# Patient Record
Sex: Female | Born: 1950 | Race: White | Hispanic: No | Marital: Single | Smoking: Never smoker
Health system: Southern US, Community
[De-identification: ages and names within clinical notes are randomized; demographics above are authoritative.]

## PROBLEM LIST (undated history)

## (undated) DIAGNOSIS — I10 Essential (primary) hypertension: Secondary | ICD-10-CM

## (undated) DIAGNOSIS — D369 Benign neoplasm, unspecified site: Secondary | ICD-10-CM

## (undated) DIAGNOSIS — F5104 Psychophysiologic insomnia: Secondary | ICD-10-CM

## (undated) DIAGNOSIS — G459 Transient cerebral ischemic attack, unspecified: Secondary | ICD-10-CM

## (undated) DIAGNOSIS — I700 Atherosclerosis of aorta: Secondary | ICD-10-CM

## (undated) DIAGNOSIS — G629 Polyneuropathy, unspecified: Secondary | ICD-10-CM

## (undated) DIAGNOSIS — F419 Anxiety disorder, unspecified: Secondary | ICD-10-CM

## (undated) DIAGNOSIS — M8580 Other specified disorders of bone density and structure, unspecified site: Secondary | ICD-10-CM

## (undated) HISTORY — DX: Polyneuropathy, unspecified: G62.9

## (undated) HISTORY — DX: Benign neoplasm, unspecified site: D36.9

## (undated) HISTORY — PX: BLEPHAROPLASTY: SUR158

## (undated) HISTORY — DX: Anxiety disorder, unspecified: F41.9

## (undated) HISTORY — DX: Atherosclerosis of aorta: I70.0

## (undated) HISTORY — PX: BREAST SURGERY: SHX581

## (undated) HISTORY — PX: LEG SURGERY: SHX1003

## (undated) HISTORY — PX: OTHER SURGICAL HISTORY: SHX169

## (undated) HISTORY — PX: VASCULAR SURGERY: SHX849

## (undated) HISTORY — PX: ABDOMINAL HYSTERECTOMY: SHX81

## (undated) HISTORY — DX: Psychophysiologic insomnia: F51.04

## (undated) HISTORY — PX: CHOLECYSTECTOMY: SHX55

## (undated) HISTORY — DX: Other specified disorders of bone density and structure, unspecified site: M85.80

## (undated) HISTORY — DX: Essential (primary) hypertension: I10

---

## 2007-02-17 ENCOUNTER — Encounter: Payer: Self-pay | Admitting: Gastroenterology

## 2007-03-20 ENCOUNTER — Encounter: Payer: Self-pay | Admitting: Gastroenterology

## 2007-04-01 ENCOUNTER — Encounter: Payer: Self-pay | Admitting: Gastroenterology

## 2007-04-09 ENCOUNTER — Encounter: Payer: Self-pay | Admitting: Gastroenterology

## 2007-04-16 ENCOUNTER — Encounter: Payer: Self-pay | Admitting: Gastroenterology

## 2007-04-18 ENCOUNTER — Ambulatory Visit: Payer: Self-pay | Admitting: Internal Medicine

## 2007-05-14 ENCOUNTER — Encounter: Payer: Self-pay | Admitting: Gastroenterology

## 2007-05-19 ENCOUNTER — Ambulatory Visit: Payer: Self-pay | Admitting: Internal Medicine

## 2007-05-23 ENCOUNTER — Ambulatory Visit: Payer: Self-pay | Admitting: Cardiology

## 2007-05-23 ENCOUNTER — Inpatient Hospital Stay (HOSPITAL_BASED_OUTPATIENT_CLINIC_OR_DEPARTMENT_OTHER): Admission: RE | Admit: 2007-05-23 | Discharge: 2007-05-23 | Payer: Self-pay | Admitting: Cardiology

## 2007-07-04 ENCOUNTER — Encounter: Payer: Self-pay | Admitting: Gastroenterology

## 2007-07-25 ENCOUNTER — Encounter: Payer: Self-pay | Admitting: Gastroenterology

## 2007-09-01 DIAGNOSIS — I00 Rheumatic fever without heart involvement: Secondary | ICD-10-CM | POA: Insufficient documentation

## 2007-09-01 DIAGNOSIS — R110 Nausea: Secondary | ICD-10-CM | POA: Insufficient documentation

## 2007-09-02 ENCOUNTER — Ambulatory Visit: Payer: Self-pay | Admitting: Gastroenterology

## 2007-09-02 DIAGNOSIS — F411 Generalized anxiety disorder: Secondary | ICD-10-CM | POA: Insufficient documentation

## 2007-09-02 DIAGNOSIS — J189 Pneumonia, unspecified organism: Secondary | ICD-10-CM | POA: Insufficient documentation

## 2007-09-02 DIAGNOSIS — R51 Headache: Secondary | ICD-10-CM

## 2007-09-02 DIAGNOSIS — K589 Irritable bowel syndrome without diarrhea: Secondary | ICD-10-CM

## 2007-09-02 DIAGNOSIS — K8020 Calculus of gallbladder without cholecystitis without obstruction: Secondary | ICD-10-CM | POA: Insufficient documentation

## 2007-09-02 DIAGNOSIS — R519 Headache, unspecified: Secondary | ICD-10-CM | POA: Insufficient documentation

## 2007-09-02 DIAGNOSIS — F329 Major depressive disorder, single episode, unspecified: Secondary | ICD-10-CM | POA: Insufficient documentation

## 2007-09-02 DIAGNOSIS — E785 Hyperlipidemia, unspecified: Secondary | ICD-10-CM | POA: Insufficient documentation

## 2007-09-02 LAB — CONVERTED CEMR LAB
Ferritin: 52.6 ng/mL (ref 10.0–291.0)
Iron: 43 ug/dL (ref 42–145)
Tissue Transglutaminase Ab, IgA: 0.2 units (ref <7)

## 2007-09-10 ENCOUNTER — Encounter: Payer: Self-pay | Admitting: Gastroenterology

## 2007-10-14 ENCOUNTER — Ambulatory Visit: Payer: Self-pay | Admitting: Gastroenterology

## 2008-05-15 DIAGNOSIS — R002 Palpitations: Secondary | ICD-10-CM

## 2008-05-15 DIAGNOSIS — I4949 Other premature depolarization: Secondary | ICD-10-CM | POA: Insufficient documentation

## 2008-05-15 DIAGNOSIS — R079 Chest pain, unspecified: Secondary | ICD-10-CM

## 2008-05-24 ENCOUNTER — Encounter: Payer: Self-pay | Admitting: Internal Medicine

## 2008-05-24 ENCOUNTER — Ambulatory Visit: Payer: Self-pay | Admitting: Internal Medicine

## 2010-06-27 NOTE — Cardiovascular Report (Signed)
[name], Amanda Middleton NO.:  [id number]   MEDICAL RECORD NO.:  [id number]          PATIENT TYPE:  OIB   LOCATION:  1962                         FACILITY:  [facility]   PHYSICIAN:  Arturo Morton. Riley Kill, MD, FACCDATE OF BIRTH:  [birth year]   DATE OF PROCEDURE:  DATE OF DISCHARGE:  05/23/2007                            CARDIAC CATHETERIZATION   INDICATIONS:  Ms. Coppinger is a 60 year old woman who presents with  recurrent episodes of chest pain and frequent palpitations.  She has had  a thorough GI evaluation.  Current study was done to assess coronary  anatomy.  The patient does have rather marked hypercholesterolemia.   PROCEDURES:  1. Left heart catheterization.  2. Selective coronary arteriography.  3. Selective left ventriculography.   DESCRIPTION OF PROCEDURE:  The patient was brought to the cath lab,  prepped and draped in the usual fashion.  Through an anterior puncture,  the femoral artery was entered and a 4-French sheath was placed.  Subcutaneous Xylocaine was used for local anesthesia.  Using 4-French  catheters, views of the left and right coronaries were obtained in  multiple angiographic projections.  Central aortic and left ventricular  pressures were measured with a pigtail and ventriculography was then  performed in the RAO projection.  The procedure was tolerated without  difficulty.  Hemostasis was achieved by direct manual compression by the  cath lab staff.  The images were reviewed with the patient, and with her  permission, reviewed with the patient's brother and sister.  There were  no major complications.   HEMODYNAMIC DATA:  1. Central aortic pressure 127/76, mean 101.  2. Left ventricular pressure 133/19.  3. No gradient or pullback across the aortic valve.   ANGIOGRAPHIC DATA:  1. Ventriculography was done in the RAO projection.  Overall systolic      function was preserved.  No segmental abnormalities or contraction      were identified.  2. The ejection fraction appeared to be in excess of about 55%.  3. On plain fluoroscopy, there was evidence of calcification in the      proximal LAD.  However, there do not appear to be high-grade      obstruction at this location.  4. The left main was free of critical disease.  5. The LAD course to the apex.  There were multiple septal perforators      and a large major diagonal branch.  Other than the calcification      proximally, no significant obstruction was noted.  There was no      high-grade lesion noted at the site of calcification.  6. The circumflex provides several large marginal branches and 2      smaller marginal branches.  The circumflex and its branches      appeared to be free of significant disease.  7. The right coronary is large-caliber vessel, and smooth throughout      its appearance.  No major obstruction was noted.   CONCLUSIONS:  1. Well-preserved overall left ventricular function.  2. Calcification of the proximal left anterior descending artery.  3. Hypercholesterolemia.  4. No evidence of significant coronary obstruction.   PLAN:  1. The patient will follow up Dr. Lewayne Bunting in the office for      further evaluation.  2. Consideration of management of hypercholesterolemia will be      considered based upon accepted excepted criteria.      Arturo Morton. Riley Kill, MD, [facility]  Electronically Signed     TDS/MEDQ  D:  05/23/2007  T:  05/24/2007  Job:  [id number]   cc:   Yolanda Manges. Ladona Ridgel, MD

## 2010-06-27 NOTE — Assessment & Plan Note (Signed)
[facility]                         ELECTROPHYSIOLOGY OFFICE NOTE   Amanda Middleton, Amanda Middleton                        MRN:          [id number]  DATE:04/18/2007                            DOB:          03-19-1950    Amanda Middleton is referred today by Dr. Jeanmarie Plant in [facility] for  evaluation of irregular heartbeats.  The patient has multiple medical  problems, but her main complaint is that over the last several years she  has had severe nausea, headache and palpitations.  She has documented  PVCs on cardiac monitoring.  She has a history of persistent headache  and nausea which date back to November of 2007.  She has seen multiple  specialists without much improvement.  She has never had syncope but she  has severe chest pain, almost all the time.  It is not related to  exertion.  It is unexplained.   PAST MEDICAL HISTORY:  1. Cholecystectomy in January of 2008.  2. History of a breast cyst.  3. History of palpitations.  4. History of a hysterectomy in the past.   FAMILY HISTORY:  Notable for both parents being deceased; her mother of  breast cancer, her father of coronary disease in his 98s.   MEDICATIONS:  1. Xanax.  2. Aspirin.  3. Clomipramine.  4. Tramadol.   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   REVIEW OF SYSTEMS:  Otherwise negative, except for problems with anxiety  and depression, dysuria and gastroesophageal reflux disease.   PHYSICAL EXAMINATION:  GENERAL:  She is a pleasant middle-aged woman in  no acute distress.  VITAL SIGNS:  Blood pressure today was 142/100, the pulse was 100 and  regular, respirations were 18, weight was 181 pounds.  HEENT:  Normocephalic and atraumatic.  Pupils equal and round.  The  oropharynx was moist.  Sclerae were anicteric.  NECK:  No jugular distention.  There was no thyromegaly.  Trachea was  midline.  Carotids were 2+ and  symmetric.  LUNGS:  Clear bilaterally to auscultation.  No wheezes, rales or rhonchi  were present.  There was no increased work of breathing.  CARDIOVASCULAR:  Regular rate and rhythm with normal S1 and S2.  There  were no murmurs, rubs or gallops that I could appreciate.  The PMI was  not enlarged and laterally displaced.  ABDOMEN:  Soft, nontender.  There was no organomegaly.  EXTREMITIES:  No sinus, clubbing or edema.  Pulses were 2+ and  symmetric.  NEUROLOGIC:  Alert and oriented x3.  Cranial nerves were intact.  Strength was 5/5 and symmetric.   ELECTROCARDIOGRAM:  EKG demonstrates sinus rhythm.  There was normal  axis and intervals, though we only had faxed copies of her EKGs to look  at.   IMPRESSION:  1. Severe palpitations.  2. Chest pain of unclear etiology.  3. History of recurrent chronic nausea and headaches.   DISCUSSION:  The etiology of the patient's symptoms is unclear to me.  I  have recommended that we try some  beta blockers to see if it will help  with her palpitations.  I cannot document any episodes of SVT or VT on  the cardiac monitor.  I will plan to see the patient back in several  weeks.     Amanda Canning. Ladona Ridgel, MD  Electronically Signed    GWT/MedQ  DD: 04/18/2007  DT: 04/20/2007  Job #: [id number]   cc:   Jeanmarie Plant

## 2010-06-27 NOTE — Assessment & Plan Note (Signed)
[facility]                         ELECTROPHYSIOLOGY OFFICE NOTE   AYNSLEY, FLEET                        MRN:          [id number]  DATE:05/19/2007                            DOB:          18-Sep-1950     Ms. Turberville presents today in followup.  She is a pleasant, middle-age  woman with a history of palpitations and documented PVCs as well as  nausea whom I saw initially back in March.  At that time, we started her  on some beta blockers to help prevent her palpitations.  She stated she  did well for week or two but over the last week or two has had chest  pressure which has been waxing and waning, worse yesterday.  This  pressure was initially related to exertion.  However, she has had  recurrent episodes which are not related to exertion.  There is no  associated nausea or vomiting, although she does have chronic and  intermittent nausea and headaches in the past.  The position does not  worsen or improve the pain.  The pain is described as a dull pressure  sensation.  It does not radiate.  She returns today for additional  evaluation.  She has had no syncope.   PHYSICAL EXAMINATION:  GENERAL:  Demonstrates a pleasant and very well-  appearing but somewhat anxious-appearing woman in no distress.  VITAL SIGNS:  Blood pressure was 148/98. The pulse was 92 and regular.  Respirations were 18.  Weight was 184 pounds.  NECK:  Revealed no jugular distention.  There is no thyromegaly.  Trachea is midline.  Carotid 2+ and symmetric.  LUNGS:  Clear bilaterally to auscultation.  No wheezes, rales or rhonchi  are present.  No increased work of breathing.  CARDIOVASCULAR:  Exam revealed a regular rate and rhythm with normal S1-  S2.  There is no S4 gallop.  ABDOMEN:  Soft, nontender.  There is no organomegaly.  EXTREMITIES:  Demonstrated no edema.   EKG is normal.   IMPRESSION:  1. Palpitations.  2. Chest pain.  3. History of PVCs.   DISCUSSION:   The patient does have a family history of coronary disease,  and her father died actually while undergoing catheterization.  The  patient's symptoms have some typical as well as atypical features.  She  is quite anxious.  She very much would like to be absolutely certain to  know whether or not she has coronary disease.  To this end, I have  recommended proceeding with left heart catheterization to define her  coronary anatomy.  This will be scheduled in a week  or so as her schedule permits.  Obviously, if she has recurrent chest  pain, she should go to the emergency room.     Doylene Canning. Ladona Ridgel, MD  Electronically Signed    GWT/MedQ  DD: 05/19/2007  DT: 05/19/2007  Job #: [id number]   cc:   Jeanmarie Plant

## 2013-09-21 DIAGNOSIS — I10 Essential (primary) hypertension: Secondary | ICD-10-CM | POA: Insufficient documentation

## 2014-12-23 ENCOUNTER — Encounter (HOSPITAL_COMMUNITY): Payer: Self-pay | Admitting: *Deleted

## 2014-12-23 ENCOUNTER — Emergency Department (HOSPITAL_COMMUNITY)
Admission: EM | Admit: 2014-12-23 | Discharge: 2014-12-24 | Disposition: A | Discharge status: Home / Self Care | Payer: Medicare Other | Attending: Emergency Medicine | Admitting: Emergency Medicine

## 2014-12-23 DIAGNOSIS — R531 Weakness: Secondary | ICD-10-CM | POA: Diagnosis not present

## 2014-12-23 DIAGNOSIS — R51 Headache: Secondary | ICD-10-CM | POA: Diagnosis not present

## 2014-12-23 DIAGNOSIS — R519 Headache, unspecified: Secondary | ICD-10-CM

## 2014-12-23 DIAGNOSIS — R479 Unspecified speech disturbances: Secondary | ICD-10-CM | POA: Insufficient documentation

## 2014-12-23 DIAGNOSIS — R110 Nausea: Secondary | ICD-10-CM | POA: Diagnosis not present

## 2014-12-23 DIAGNOSIS — Z79899 Other long term (current) drug therapy: Secondary | ICD-10-CM | POA: Insufficient documentation

## 2014-12-23 DIAGNOSIS — Z7982 Long term (current) use of aspirin: Secondary | ICD-10-CM | POA: Insufficient documentation

## 2014-12-23 DIAGNOSIS — Z8673 Personal history of transient ischemic attack (TIA), and cerebral infarction without residual deficits: Secondary | ICD-10-CM | POA: Diagnosis not present

## 2014-12-23 DIAGNOSIS — R410 Disorientation, unspecified: Secondary | ICD-10-CM | POA: Insufficient documentation

## 2014-12-23 HISTORY — DX: Transient cerebral ischemic attack, unspecified: G45.9

## 2014-12-23 LAB — CBC
HCT: 39.7 % (ref 36.0–46.0)
HEMOGLOBIN: 13.3 g/dL (ref 12.0–15.0)
MCH: 29.8 pg (ref 26.0–34.0)
MCHC: 33.5 g/dL (ref 30.0–36.0)
MCV: 89 fL (ref 78.0–100.0)
PLATELETS: 259 10*3/uL (ref 150–400)
RBC: 4.46 MIL/uL (ref 3.87–5.11)
RDW: 13.4 % (ref 11.5–15.5)
WBC: 8.7 10*3/uL (ref 4.0–10.5)

## 2014-12-23 LAB — BASIC METABOLIC PANEL WITH GFR
Anion gap: 8 (ref 5–15)
BUN: 13 mg/dL (ref 6–20)
CO2: 30 mmol/L (ref 22–32)
Calcium: 9.3 mg/dL (ref 8.9–10.3)
Chloride: 101 mmol/L (ref 101–111)
Creatinine, Ser: 0.85 mg/dL (ref 0.44–1.00)
GFR calc Af Amer: >60 mL/min
GFR calc non Af Amer: >60 mL/min
Glucose, Bld: 119 mg/dL — ABNORMAL HIGH (ref 65–99)
Potassium: 3.1 mmol/L — ABNORMAL LOW (ref 3.5–5.1)
Sodium: 139 mmol/L (ref 135–145)

## 2014-12-23 LAB — CBG MONITORING, ED: GLUCOSE-CAPILLARY: 124 mg/dL — AB (ref 65–99)

## 2014-12-23 NOTE — ED Notes (Signed)
Pt CBG result was 124. Informed RN.

## 2014-12-23 NOTE — ED Notes (Signed)
Pt declines offer for zofran ODT at this time - states "that does not do anything for me." Pt also admits to taking a sleeping pill pta.

## 2014-12-23 NOTE — ED Provider Notes (Signed)
CSN: [id number]   Arrival date & time 12/23/14 2050  History  By signing my name below, I, Altamease Oiler, attest that this documentation has been prepared under the direction and in the presence of Jola Schmidt, MD. Electronically Signed: Altamease Oiler, ED Scribe. 12/24/2014. 2:09 AM.  Chief Complaint  Patient presents with  . Nausea  . Weakness    HPI The history is provided by the patient. No language interpreter was used.   Amanda Middleton is a 64 y.o. female with history of TIA and neuropathy who presents to the Emergency Department complaining of intermittent left sided headache with onset 2 weeks ago. She describes the pain as throbbing "like your heart"" and notes that it has become more constant in the last 2 days. 800 mg Ibuprofen provided transient relief in pain at home. Associated symptoms include contractions ("drawing up") in both hands (L>R) stating "I keep dropping things", 6 days of near daily nausea, generalized weakness that she associates with the nausea, and speech difficulty ("sometimes I can't think what to say"). She saw her PCP in [place] 6 days ago where she had no testing and was referred to a neurologist. She was also evaluated in the ED at [facility] 6 days ago after the onset of weakness where she saw a neurologist and had a negative MRI and CT head. Pt was also evaluated in the ED at [facility] 4 days ago where she was again evaluated for generalized weakness and had another negative head CT. The headache and nausea persisted and 2 days ago she was evaluated at another ED and treated for constipation. Over the course of her ED visits she determined that neither Zofran nor phenergan improve her nausea. She has no nausea at present. Pt denies vomiting.   Church members at the bedside endorse that the patient's speech has seemed different and her behavior has been abnormal over the last several days. They deny knowledge of any drug or alcohol use and endorse "20  years worth of deep mental health stuff".   Past Medical History  Diagnosis Date  . TIA (transient ischemic attack)     Past Surgical History  Procedure Laterality Date  . Abdominal hysterectomy    . Cholecystectomy    . Breast surgery      History reviewed. No pertinent family history.  Social History  Substance Use Topics  . Smoking status: Never Smoker   . Smokeless tobacco: None  . Alcohol Use: No     Review of Systems 10 Systems reviewed and all are negative for acute change except as noted in the HPI. Home Medications   Prior to Admission medications   Medication Sig Start Date End Date Taking? Authorizing Provider  amLODipine (NORVASC) 5 MG tablet Take 5 mg by mouth daily.   Yes Historical Provider, MD  aspirin EC 81 MG tablet Take 81 mg by mouth daily.   Yes Historical Provider, MD  benazepril-hydrochlorthiazide (LOTENSIN HCT) 20-25 MG tablet Take 1 tablet by mouth daily.   Yes Historical Provider, MD  cholecalciferol (VITAMIN D) 1000 UNITS tablet Take 1,000 Units by mouth daily.   Yes Historical Provider, MD  gabapentin (NEURONTIN) 100 MG capsule Take 100 mg by mouth 2 (two) times daily.   Yes Historical Provider, MD  metoprolol succinate (TOPROL-XL) 25 MG 24 hr tablet Take 25 mg by mouth daily.   Yes Historical Provider, MD  zolpidem (AMBIEN) 10 MG tablet Take 10 mg by mouth at bedtime.   Yes Historical  Provider, MD    Allergies  Review of patient's allergies indicates no known allergies.  Triage Vitals: BP 107/63 mmHg  Pulse 63  Temp(Src) 97.6 F (36.4 C) (Oral)  Resp 10  Wt 174 lb 4 oz (79.039 kg)  SpO2 98%  Physical Exam  Constitutional: She is oriented to person, place, and time. She appears well-developed and well-nourished.  HENT:  Head: Normocephalic and atraumatic.  Eyes: Pupils are equal, round, and reactive to light.  Cardiovascular: Regular rhythm.   Pulmonary/Chest: Effort normal.  Abdominal: Soft.  Musculoskeletal: Normal range of motion.   Neurological: She is alert and oriented to person, place, and time. She has normal reflexes. She displays normal reflexes. No cranial nerve deficit. She exhibits normal muscle tone. Coordination normal.  5/5 strength in major muscle groups of  bilateral upper and lower extremities. Speech normal. No facial asymetry.   Nursing note and vitals reviewed.   ED Course  .Lumbar Puncture Performed by: Jola Schmidt Authorized by: Jola Schmidt Consent: Verbal consent obtained. Risks and benefits: risks, benefits and alternatives were discussed Consent given by: patient Time out: Immediately prior to procedure a "time out" was called to verify the correct patient, procedure, equipment, support staff and site/side marked as required. Indications: evaluation for infection and evaluation for altered mental status Anesthesia: local infiltration Local anesthetic: lidocaine 1% without epinephrine Anesthetic total: 12 ml Patient sedated: no Preparation: Patient was prepped and draped in the usual sterile fashion. Lumbar space: L3-L4 interspace Patient's position: left lateral decubitus Needle gauge: 20 Number of attempts: 2 Opening pressure: 23 cm H2O Fluid appearance: clear Tubes of fluid: 4 Total volume: 4 ml Post-procedure: site cleaned, pressure dressing applied and adhesive bandage applied Patient tolerance: Patient tolerated the procedure well with no immediate complications    DIAGNOSTIC STUDIES: Oxygen Saturation is 98% on RA, normal by my interpretation.    COORDINATION OF CARE: 11:25 PM Discussed treatment plan which includes lab work, EKG, CT head, and LP with pt at bedside and pt agreed to plan.  2:08 AM I re-evaluated the patient and her headache has resolved.   Labs Reviewed  BASIC METABOLIC PANEL - Abnormal; Notable for the following:    Potassium 3.1 (*)    Glucose, Bld 119 (*)    All other components within normal limits  URINALYSIS, ROUTINE W REFLEX MICROSCOPIC (NOT  AT [facility]) - Abnormal; Notable for the following:    Leukocytes, UA SMALL (*)    All other components within normal limits  CSF CELL COUNT WITH DIFFERENTIAL - Abnormal; Notable for the following:    RBC Count, CSF 16 (*)    All other components within normal limits  CBG MONITORING, ED - Abnormal; Notable for the following:    Glucose-Capillary 124 (*)    All other components within normal limits  GRAM STAIN  CSF CULTURE  CBC  AMMONIA  ETHANOL  URINE RAPID DRUG SCREEN, HOSP PERFORMED  GLUCOSE, CSF  CSF CELL COUNT WITH DIFFERENTIAL  PROTEIN, CSF  URINE MICROSCOPIC-ADD ON  HEPATIC FUNCTION PANEL    Imaging Review Ct Head Wo Contrast  12/24/2014  CLINICAL DATA:  Altered mental status and headache EXAM: CT HEAD WITHOUT CONTRAST TECHNIQUE: Contiguous axial images were obtained from the base of the skull through the vertex without intravenous contrast. COMPARISON:  12/19/2014 FINDINGS: Skull and Sinuses:Negative for fracture or destructive process. The mastoids, middle ears, and imaged paranasal sinuses are clear. Partly mineralized subcutaneous nodule in the left parietal scalp, likely a dermal inclusion cyst Orbits:  No acute abnormality. Brain: No evidence of acute infarction, hemorrhage, hydrocephalus, or mass lesion/mass effect. IMPRESSION: No acute finding or explanation for headache. Electronically Signed   By: Monte Fantasia M.D.   On: 12/24/2014 00:13   I personally reviewed and evaluated these images and lab results as a part of my medical decision-making.   EKG Interpretation  Date/Time:  Thursday December 23 2014 20:59:57 EST Ventricular Rate:  81 PR Interval:  164 QRS Duration: 80 QT Interval:  366 QTC Calculation: 425 R Axis:   40 Text Interpretation:  Normal sinus rhythm Nonspecific ST abnormality Abnormal ECG No old tracing to compare Confirmed by Merlina Marchena  MD, Lennette Bihari ([id number]) on 12/24/2014 12:43:14 AM    MDM   Final diagnoses:  Headache, unspecified headache type   Nausea  Confusion    Patient has had extensive workup at outside hospital including neurology consultation, CT head and MRI.  Patient reports this persistent headache.  It seems to be an intermittent issue but not improving.  Today it's been worse.  Friends are also concerned that she's had slightly more confusion.  Considerations initially included encephalitis and thus lumbar puncture was performed.  LP is without significant abnormalities.  Patient was given treatment for her headache as a possible migraine with Reglan and pain medication.  She had complete resolution of her headache.  She had complete resolution of her nausea.  She's tolerating fluids.  She walked around the emergency department without difficulty.  She feels much better at this time.  This may be/migraine variant.  She will need outpatient neurology follow-up.   I personally performed the services described in this documentation, which was scribed in my presence. The recorded information has been reviewed and is accurate.       Jola Schmidt, MD 12/24/14 [phone]

## 2014-12-23 NOTE — ED Notes (Signed)
Pt reports multiple symptoms x2 weeks - pt admits to hx of dx of neuropathy and is attempting to be seen by neurology - pt states she experiences intermittent episodes of weakness and "hands drawing up" only when pt has acute onset of nausea which pt reports occurs every 2 days. Pt was seen at [place] [facility] ER on Sunday for weakness. When patient questioned about focal weakness to left arm and leg pt states "when I have this nausea I feel like I am going to snap, I have pain and such in my left leg but get weak all over when I'm nauseous, I have no idea when this all started." Pt w/o any gross neuro deficits - grip strengths equal to both sides and equal leg strength. No facial droop or slurred speech. Pt reports difficulty finding her words and understanding others intermittently x1 week. Pt A&Ox4, speaking complete sentences. No acute distress.

## 2014-12-23 NOTE — ED Notes (Signed)
Taken for CT scan at this time. 

## 2014-12-24 ENCOUNTER — Emergency Department (HOSPITAL_COMMUNITY): Payer: Medicare Other

## 2014-12-24 DIAGNOSIS — R110 Nausea: Secondary | ICD-10-CM | POA: Diagnosis not present

## 2014-12-24 LAB — GRAM STAIN

## 2014-12-24 LAB — URINALYSIS, ROUTINE W REFLEX MICROSCOPIC
Bilirubin Urine: NEGATIVE
GLUCOSE, UA: NEGATIVE mg/dL
Hgb urine dipstick: NEGATIVE
KETONES UR: NEGATIVE mg/dL
NITRITE: NEGATIVE
PROTEIN: NEGATIVE mg/dL
Specific Gravity, Urine: 1.009 (ref 1.005–1.030)
UROBILINOGEN UA: 1 mg/dL (ref 0.0–1.0)
pH: 6.5 (ref 5.0–8.0)

## 2014-12-24 LAB — CSF CELL COUNT WITH DIFFERENTIAL
RBC COUNT CSF: 0 /mm3
RBC COUNT CSF: 16 /mm3 — AB
TUBE #: 1
Tube #: 4
WBC CSF: 2 /mm3 (ref 0–5)
WBC, CSF: 2 /mm3 (ref 0–5)

## 2014-12-24 LAB — RAPID URINE DRUG SCREEN, HOSP PERFORMED
Amphetamines: NOT DETECTED
Barbiturates: NOT DETECTED
Benzodiazepines: NOT DETECTED
COCAINE: NOT DETECTED
OPIATES: NOT DETECTED
TETRAHYDROCANNABINOL: NOT DETECTED

## 2014-12-24 LAB — HEPATIC FUNCTION PANEL
ALBUMIN: 4 g/dL (ref 3.5–5.0)
ALK PHOS: 82 U/L (ref 38–126)
ALT: 17 U/L (ref 14–54)
AST: 20 U/L (ref 15–41)
BILIRUBIN TOTAL: 0.7 mg/dL (ref 0.3–1.2)
Bilirubin, Direct: 0.1 mg/dL (ref 0.1–0.5)
Indirect Bilirubin: 0.6 mg/dL (ref 0.3–0.9)
Total Protein: 7 g/dL (ref 6.5–8.1)

## 2014-12-24 LAB — GLUCOSE, CSF: GLUCOSE CSF: 57 mg/dL (ref 40–70)

## 2014-12-24 LAB — AMMONIA: AMMONIA: 19 umol/L (ref 9–35)

## 2014-12-24 LAB — PROTEIN, CSF: Total  Protein, CSF: 42 mg/dL (ref 15–45)

## 2014-12-24 LAB — URINE MICROSCOPIC-ADD ON

## 2014-12-24 LAB — ETHANOL

## 2014-12-24 MED ORDER — METOCLOPRAMIDE HCL 5 MG/ML IJ SOLN
10.0000 mg | Freq: Once | INTRAMUSCULAR | Status: AC
  Administered 2014-12-24: 10 mg via INTRAVENOUS
  Filled 2014-12-24: qty 2

## 2014-12-24 MED ORDER — LIDOCAINE HCL (PF) 1 % IJ SOLN
30.0000 mL | Freq: Once | INTRAMUSCULAR | Status: AC
  Administered 2014-12-24: 30 mL
  Filled 2014-12-24: qty 30

## 2014-12-24 MED ORDER — FENTANYL CITRATE (PF) 100 MCG/2ML IJ SOLN
50.0000 ug | Freq: Once | INTRAMUSCULAR | Status: AC
  Administered 2014-12-24: 50 ug via INTRAVENOUS
  Filled 2014-12-24: qty 2

## 2014-12-24 MED ORDER — LORAZEPAM 2 MG/ML IJ SOLN
1.0000 mg | Freq: Once | INTRAMUSCULAR | Status: AC
  Administered 2014-12-24: 1 mg via INTRAVENOUS
  Filled 2014-12-24: qty 1

## 2014-12-24 MED ORDER — METOCLOPRAMIDE HCL 10 MG PO TABS: 10.0000 mg | ORAL_TABLET | Freq: Four times a day (QID) | ORAL | Status: DC

## 2014-12-24 NOTE — ED Notes (Signed)
Back from CT

## 2014-12-27 LAB — CSF CULTURE: CULTURE: NO GROWTH

## 2014-12-27 LAB — CSF CULTURE W GRAM STAIN

## 2015-01-21 ENCOUNTER — Encounter: Payer: Self-pay | Admitting: Neurology

## 2015-01-21 ENCOUNTER — Other Ambulatory Visit (INDEPENDENT_AMBULATORY_CARE_PROVIDER_SITE_OTHER): Payer: Commercial Managed Care - HMO

## 2015-01-21 ENCOUNTER — Ambulatory Visit (INDEPENDENT_AMBULATORY_CARE_PROVIDER_SITE_OTHER): Payer: Medicare Other | Admitting: Neurology

## 2015-01-21 VITALS — BP 126/74 | HR 70 | Ht 67.0 in | Wt 179.0 lb

## 2015-01-21 DIAGNOSIS — R110 Nausea: Secondary | ICD-10-CM

## 2015-01-21 DIAGNOSIS — F411 Generalized anxiety disorder: Secondary | ICD-10-CM

## 2015-01-21 DIAGNOSIS — F05 Delirium due to known physiological condition: Secondary | ICD-10-CM

## 2015-01-21 DIAGNOSIS — G609 Hereditary and idiopathic neuropathy, unspecified: Secondary | ICD-10-CM

## 2015-01-21 LAB — SEDIMENTATION RATE: SED RATE: 31 mm/h — AB (ref 0–22)

## 2015-01-21 LAB — TSH: TSH: 1.14 u[IU]/mL (ref 0.35–4.50)

## 2015-01-21 LAB — VITAMIN B12: Vitamin B-12: 382 pg/mL (ref 211–911)

## 2015-01-21 NOTE — Patient Instructions (Signed)
I really think the symptoms you were experiencing were anxiety.  However, I want to check another test, namely an EEG which records your brain waves. To look into causes of neuropathy, we will pursue further testing:  Check labs, including ANA, Sed Rate, B12, TSH, SPEP/IFE  We will schedule you for a nerve study test to evaluate for polyneuropathy Further tests pending results.  Follow up after testing

## 2015-01-21 NOTE — Progress Notes (Signed)
NEUROLOGY CONSULTATION NOTE  Amanda Middleton MRN: [id number] DOB: 06/21/1950  Referring provider: ED referral Primary care provider: Grier Rocher  Reason for consult:  Headache  HISTORY OF PRESENT ILLNESS: Amanda Middleton is a 64 year old right-handed female with neuropathy, anxiety, depression and history of TIA who presents for episode of nausea and confusion, as well as neuropathy.  MRI reports from [facility] reviewed.  Imaging of brain CT from 12/24/14 was reviewed.  Labs reviewed.  A couple of months ago, she reports being under a great deal of stress, which were family-related, concerning the church, as well as the current politics of the election.  In late-October/early-November, she had a throbbing in the left side of there head, which lasted a couple of weeks.  After it resolved, she began having spells in which she developed acute onset of severe nausea without vomiting.  She had a sensation of diffuse weakness.  There was no associated headache or visual disturbance.  Afterwards, she would report language deficits, such as nonsensical speech or difficulty reading or writing.  She also would lose her train of thought when talking with friends and family.  Over a period of two weeks, she went to 4 different EDs for evaluation.  She went to the ED at [facility], where she was evaluated by neurology.  CT and MRI/MRA/MRV of the head were unremarkable.  She then was seen in the ED at [facility], where another head CT was performed and was negative.  Due to persistent nausea and headache, she then followed up at another ED, where she was treated for constipation.  [facility] after friends reported she was acting more confused.  LP was performed to evaluate for encephalitis, which was negative.  Her language disturbance persisted after discharge.  For the past month, she decided to stay at home and rest.  Since that time, she has had resolution of nausea  and speech disturbance.  She feels more rested and less stress.  She reports episodes of diffuse weakness in the past and was told she had TIAs.  She denies family or personal history of seizures or migraines.  For two years, she has had gradual worsening of neuropathy pain.  She describes it as burning in her feet, and later in her knees and hands.  There is no associated weakness or numbness.  She says her brother had debilitating neuropathy which started the same way.  He was disabled at age 15.  He was told it was due to longstanding farm work.  She takes gabapentin 600mg  twice daily, which helps.  PAST MEDICAL HISTORY: Past Medical History  Diagnosis Date  . TIA (transient ischemic attack)     PAST SURGICAL HISTORY: Past Surgical History  Procedure Laterality Date  . Abdominal hysterectomy    . Cholecystectomy    . Breast surgery      MEDICATIONS: Current Outpatient Prescriptions on File Prior to Visit  Medication Sig Dispense Refill  . amLODipine (NORVASC) 5 MG tablet Take 5 mg by mouth daily.    Marland Kitchen aspirin EC 81 MG tablet Take 81 mg by mouth daily.    . benazepril-hydrochlorthiazide (LOTENSIN HCT) 20-25 MG tablet Take 1 tablet by mouth daily.    . cholecalciferol (VITAMIN D) 1000 UNITS tablet Take 1,000 Units by mouth daily.    Marland Kitchen gabapentin (NEURONTIN) 100 MG capsule Take 100 mg by mouth 2 (two) times daily.    . metoCLOPramide (REGLAN) 10  MG tablet Take 1 tablet (10 mg total) by mouth every 6 (six) hours. 12 tablet 0  . metoprolol succinate (TOPROL-XL) 25 MG 24 hr tablet Take 25 mg by mouth daily.     No current facility-administered medications on file prior to visit.    ALLERGIES: No Known Allergies  FAMILY HISTORY: No family history on file.  SOCIAL HISTORY: Social History   Social History  . Marital Status: Single    Spouse Name: N/A  . Number of Children: N/A  . Years of Education: N/A   Occupational History  . Not on file.   Social History Main Topics    . Smoking status: Never Smoker   . Smokeless tobacco: Not on file  . Alcohol Use: No  . Drug Use: No  . Sexual Activity: Not on file   Other Topics Concern  . Not on file   Social History Narrative   4 year college    REVIEW OF SYSTEMS: Constitutional: No fevers, chills, or sweats, no generalized fatigue, change in appetite Eyes: No visual changes, double vision, eye pain Ear, nose and throat: No hearing loss, ear pain, nasal congestion, sore throat Cardiovascular: No chest pain, palpitations Respiratory:  No shortness of breath at rest or with exertion, wheezes GastrointestinaI: Nausea resolved.  Some constipation Genitourinary:  No dysuria, urinary retention or frequency Musculoskeletal:  No neck pain, back pain Integumentary: No rash, pruritus, skin lesions Neurological: as above Psychiatric: insomnia, anxiety Endocrine: No palpitations, fatigue, diaphoresis, mood swings, change in appetite, change in weight, increased thirst Hematologic/Lymphatic:  No anemia, purpura, petechiae. Allergic/Immunologic: no itchy/runny eyes, nasal congestion, recent allergic reactions, rashes  PHYSICAL EXAM: Filed Vitals:   01/21/15 0805  BP: 126/74  Pulse: 70   General: No acute distress.  Patient appears well-groomed.  Head:  Normocephalic/atraumatic Eyes:  fundi unremarkable, without vessel changes, exudates, hemorrhages or papilledema. Neck: supple, no paraspinal tenderness, full range of motion Back: No paraspinal tenderness Heart: regular rate and rhythm Lungs: Clear to auscultation bilaterally. Vascular: No carotid bruits. Neurological Exam: Mental status: alert and oriented to person, place, and time, recent and remote memory intact, fund of knowledge intact, attention and concentration intact, speech fluent and not dysarthric, language intact. Cranial nerves: CN I: not tested CN II: pupils equal, round and reactive to light, visual fields intact, fundi unremarkable, without  vessel changes, exudates, hemorrhages or papilledema. CN III, IV, VI:  full range of motion, no nystagmus, no ptosis CN V: facial sensation intact CN VII: upper and lower face symmetric CN VIII: hearing intact CN IX, X: gag intact, uvula midline CN XI: sternocleidomastoid and trapezius muscles intact CN XII: tongue midline Bulk & Tone: normal, no fasciculations. Motor:  5/5 throughout  Sensation:  Pinprick sensation intact.  Mildly reduced vibration sensation in toes. Deep Tendon Reflexes:  2+ throughout, toes downgoing. Finger to nose testing:  Without dysmetria.  Heel to shin:  Without dysmetria.  Gait:  Normal station and stride.  Able to turn but difficulty with tandem walk. Romberg negative.  IMPRESSION: 1.  Recurrent episodes of nausea and diffuse weakness with speech disturbance.  I think this is related to anxiety.  Migraine is always possible but she has no prior history of headaches and these spells clearly coincided with period of great personal stress and has resolved after period of rest.  MRIs ruled out strokes.  2.  Neuropathy.  She reports family history.  She doesn't have high-arched feet, which may be seen in some hereditary neuropathies such  as Charcot Marie Tooth.  Will require further workup  PLAN: 1.  To further investigate these spells, will get EEG to evaluate for abnormalities that may suggest increased risk for seizures 2.  To further investigate neuropathy, will check ANA, Sed Rate, B12, TSH, SPEP/IFE and will order NCV-EMG 3.  Further testing and follow up after these tests. 4.  I asked her to get disc with MRI of brain from [facility] for review.  Thank you for allowing me to take part in the care of this patient.  Metta Clines, DO  CC:  Grier Rocher

## 2015-01-21 NOTE — Progress Notes (Signed)
Chart forwarded.  

## 2015-01-24 LAB — ANA: Anti Nuclear Antibody(ANA): NEGATIVE

## 2015-01-25 LAB — SPEP & IFE WITH QIG
ALBUMIN ELP: 4 g/dL (ref 3.8–4.8)
ALPHA-1-GLOBULIN: 0.3 g/dL (ref 0.2–0.3)
ALPHA-2-GLOBULIN: 0.7 g/dL (ref 0.5–0.9)
BETA 2: 0.3 g/dL (ref 0.2–0.5)
BETA GLOBULIN: 0.4 g/dL (ref 0.4–0.6)
Gamma Globulin: 1 g/dL (ref 0.8–1.7)
IGA: 141 mg/dL (ref 69–380)
IGG (IMMUNOGLOBIN G), SERUM: 997 mg/dL (ref 690–1700)
IgM, Serum: 256 mg/dL (ref 52–322)
TOTAL PROTEIN, SERUM ELECTROPHOR: 6.8 g/dL (ref 6.1–8.1)

## 2015-01-26 ENCOUNTER — Telehealth: Payer: Self-pay

## 2015-01-26 DIAGNOSIS — G609 Hereditary and idiopathic neuropathy, unspecified: Secondary | ICD-10-CM

## 2015-01-26 NOTE — Telephone Encounter (Signed)
-----   Message from Pieter Partridge, DO sent at 01/26/2015  7:06 AM EST ----- Labs looking for causes of neuropathy are normal.  I would like her to get a nerve conduction study to evaluate for neuropathy.  I want to see her after the nerve conduction study and EEG.

## 2015-01-26 NOTE — Telephone Encounter (Signed)
Message relayed to patient. Verbalized understanding and denied questions.   

## 2015-01-26 NOTE — Progress Notes (Signed)
Opened in error

## 2015-02-03 ENCOUNTER — Other Ambulatory Visit: Payer: Commercial Managed Care - HMO

## 2015-03-08 ENCOUNTER — Ambulatory Visit (INDEPENDENT_AMBULATORY_CARE_PROVIDER_SITE_OTHER): Payer: PRIVATE HEALTH INSURANCE | Admitting: Neurology

## 2015-03-08 DIAGNOSIS — F05 Delirium due to known physiological condition: Secondary | ICD-10-CM

## 2015-03-08 DIAGNOSIS — R110 Nausea: Secondary | ICD-10-CM

## 2015-03-08 DIAGNOSIS — G609 Hereditary and idiopathic neuropathy, unspecified: Secondary | ICD-10-CM

## 2015-03-08 DIAGNOSIS — F411 Generalized anxiety disorder: Secondary | ICD-10-CM

## 2015-03-08 DIAGNOSIS — R202 Paresthesia of skin: Secondary | ICD-10-CM

## 2015-03-08 NOTE — Procedures (Signed)
[facility]  [place], [address]  [place], [place] [address] Tel: [phone] Fax:  [phone] Test Date:  03/08/2015  Patient: Amanda Middleton DOB: May 11, 1950 Physician: Narda Amber  Sex: Female Height: 5\' 7"  Ref Phys: Metta Clines, M.D.  ID#: [id number] Temp: 34.8C Technician: Jerilynn Mages. Dean   Patient Complaints: This is a 65 years old female referred for evaluation of numbness in hands and feet bilaterally.  She has family history of hereditary neuropathy.  NCV & EMG Findings: Extensive electrodiagnostic testing of the left upper and lower extremity shows: 1. All sensory responses including the left median, ulnar, sural, and superficial peroneal nerves are within normal limits. 2. All motor responses including the left median, ulnar, tibial and peroneal nerves are within normal limits. 3. There is no evidence of active or chronic motor axon loss changes affecting any of the tested muscles. Motor unit configuration and recruitment pattern is within normal limits.  Impression: This is a normal study. In particular, there is no evidence of a large fiber generalized sensorimotor polyneuropathy, diffuse myopathy, or cervical/lumbosacral radiculopathy affecting the left side.  A small fiber neuropathy cannot be excluded by this study.   _____________________________ Narda Amber, D.O.    Nerve Conduction Studies Anti Sensory Summary Table   Stim Site NR Peak (ms) Norm Peak (ms) P-T Amp (V) Norm P-T Amp  Left Median Anti Sensory (2nd Digit)  34.8C  Wrist    3.1 <3.8 28.5 >10  Left Sup Peroneal Anti Sensory (Ant Lat Mall)  34.8C  12 cm    4.1 <4.6 5.3 >3  Left Sural Anti Sensory (Lat Mall)  34.8C  Calf    3.3 <4.6 4.2 >3  Left Ulnar Anti Sensory (5th Digit)  34.8C  Wrist    3.0 <3.2 16.3 >5   Motor Summary Table   Stim Site NR Onset (ms) Norm Onset (ms) O-P Amp (mV) Norm O-P Amp Site1 Site2 Delta-0 (ms) Dist (cm) Vel (m/s) Norm Vel (m/s)  Left Median Motor (Abd  Poll Brev)  34.8C  Wrist    3.8 <4.0 6.8 >5 Elbow Wrist 4.0 22.0 55 >50  Elbow    7.8  6.6         Left Peroneal Motor (Ext Dig Brev)  34.8C  Ankle    3.2 <6.0 2.7 >2.5 B Fib Ankle 7.2 33.0 46 >40  B Fib    10.4  2.3  Poplt B Fib 2.0 10.0 50 >40  Poplt    12.4  2.3         Left Tibial Motor (Abd Hall Brev)  34.8C  Ankle    3.1 <6.0 8.6 >4 Knee Ankle 8.5 38.0 45 >40  Knee    11.6  5.6         Left Ulnar Motor (Abd Dig Minimi)  34.8C  Wrist    2.7 <3.1 8.9 >7 B Elbow Wrist 3.0 19.0 63 >50  B Elbow    5.7  8.9  A Elbow B Elbow 1.5 10.0 67 >50  A Elbow    7.2  8.8          EMG   Side Muscle Ins Act Fibs Psw Fasc Number Recrt Dur Dur. Amp Amp. Poly Poly. Comment  Left Flex Dig Long Nml Nml Nml Nml Nml Nml Nml Nml Nml Nml Nml Nml N/A  Left AntTibialis Nml Nml Nml Nml Nml Nml Nml Nml Nml Nml Nml Nml N/A  Left Gastroc Nml Nml Nml Nml Nml Nml Nml  Nml Nml Nml Nml Nml N/A  Left RectFemoris Nml Nml Nml Nml Nml Nml Nml Nml Nml Nml Nml Nml N/A  Left BicepsFemS Nml Nml Nml Nml Nml Nml Nml Nml Nml Nml Nml Nml N/A  Left GluteusMed Nml Nml Nml Nml Nml Nml Nml Nml Nml Nml Nml Nml N/A  Left 1stDorInt Nml Nml Nml Nml Nml Nml Nml Nml Nml Nml Nml Nml N/A  Left Ext Indicis Nml Nml Nml Nml Nml Nml Nml Nml Nml Nml Nml Nml N/A  Left PronatorTeres Nml Nml Nml Nml Nml Nml Nml Nml Nml Nml Nml Nml N/A  Left Biceps Nml Nml Nml Nml Nml Nml Nml Nml Nml Nml Nml Nml N/A  Left Triceps Nml Nml Nml Nml Nml Nml Nml Nml Nml Nml Nml Nml N/A  Left Deltoid Nml Nml Nml Nml Nml Nml Nml Nml Nml Nml Nml Nml N/A      Waveforms:

## 2015-03-09 ENCOUNTER — Telehealth: Payer: Self-pay

## 2015-03-09 NOTE — Procedures (Signed)
ELECTROENCEPHALOGRAM REPORT  Date of Study: 03/08/2015  Patient's Name: Amanda Middleton MRN: [id number] Date of Birth: 08-26-1950  Indication: spells consisting of headache, cognitive and speech changes  Medications: Gabapentin Amlodipine Lotensin Toprol  Technical Summary: This is a multichannel digital EEG recording, using the international 10-20 placement system with electrodes applied with paste and impedances below 5000 ohms.    Description: The EEG background is symmetric, with a well-developed posterior dominant rhythm of 9 Hz, which is reactive to eye opening and closing.  Diffuse beta activity is seen, with a bilateral frontal preponderance.  No focal or generalized abnormalities are seen.  No focal or generalized epileptiform discharges are seen.  Stage II sleep is not seen.  Hyperventilation and photic stimulation were performed, and produced no abnormalities.  ECG revealed normal cardiac rate and rhythm.  Impression: This is a normal routine EEG of the awake and drowsy states, with activating procedures.  A normal study does not rule out the possibility of a seizure disorder in this patient.  Leif Loflin R. Tomi Likens, DO

## 2015-03-09 NOTE — Telephone Encounter (Signed)
-----   Message from Pieter Partridge, DO sent at 03/09/2015 12:44 PM EST ----- eeg is normal

## 2015-03-09 NOTE — Telephone Encounter (Signed)
Voicemail box was full. Will attempt to reach again later.

## 2015-03-09 NOTE — Telephone Encounter (Signed)
Voicemail box was full. Will send letter.

## 2015-03-09 NOTE — Telephone Encounter (Signed)
-----   Message from Pieter Partridge, DO sent at 03/08/2015  4:15 PM EST ----- Nerve study shows no evidence of nerve damage.

## 2015-05-26 ENCOUNTER — Ambulatory Visit: Payer: Commercial Managed Care - HMO | Admitting: Neurology

## 2016-10-03 ENCOUNTER — Emergency Department (HOSPITAL_COMMUNITY): Payer: Medicare Other

## 2016-10-03 ENCOUNTER — Emergency Department (HOSPITAL_COMMUNITY)
Admission: EM | Admit: 2016-10-03 | Discharge: 2016-10-03 | Disposition: A | Discharge status: Home / Self Care | Payer: Medicare Other | Attending: Emergency Medicine | Admitting: Emergency Medicine

## 2016-10-03 ENCOUNTER — Encounter (HOSPITAL_COMMUNITY): Payer: Self-pay | Admitting: *Deleted

## 2016-10-03 DIAGNOSIS — Z8673 Personal history of transient ischemic attack (TIA), and cerebral infarction without residual deficits: Secondary | ICD-10-CM | POA: Diagnosis not present

## 2016-10-03 DIAGNOSIS — Z79899 Other long term (current) drug therapy: Secondary | ICD-10-CM | POA: Diagnosis not present

## 2016-10-03 DIAGNOSIS — R51 Headache: Secondary | ICD-10-CM

## 2016-10-03 DIAGNOSIS — R1013 Epigastric pain: Secondary | ICD-10-CM | POA: Diagnosis not present

## 2016-10-03 DIAGNOSIS — R519 Headache, unspecified: Secondary | ICD-10-CM

## 2016-10-03 DIAGNOSIS — Z7982 Long term (current) use of aspirin: Secondary | ICD-10-CM | POA: Diagnosis not present

## 2016-10-03 LAB — URINALYSIS, ROUTINE W REFLEX MICROSCOPIC
Bilirubin Urine: NEGATIVE
GLUCOSE, UA: NEGATIVE mg/dL
HGB URINE DIPSTICK: NEGATIVE
KETONES UR: NEGATIVE mg/dL
LEUKOCYTES UA: NEGATIVE
NITRITE: NEGATIVE
PH: 8 (ref 5.0–8.0)
PROTEIN: NEGATIVE mg/dL
SPECIFIC GRAVITY, URINE: 1.004 — AB (ref 1.005–1.030)

## 2016-10-03 LAB — CBC
HEMATOCRIT: 38.1 % (ref 36.0–46.0)
HEMOGLOBIN: 12.5 g/dL (ref 12.0–15.0)
MCH: 29.6 pg (ref 26.0–34.0)
MCHC: 32.8 g/dL (ref 30.0–36.0)
MCV: 90.3 fL (ref 78.0–100.0)
Platelets: 235 10*3/uL (ref 150–400)
RBC: 4.22 MIL/uL (ref 3.87–5.11)
RDW: 13.6 % (ref 11.5–15.5)
WBC: 7.8 10*3/uL (ref 4.0–10.5)

## 2016-10-03 LAB — COMPREHENSIVE METABOLIC PANEL
ALBUMIN: 3.9 g/dL (ref 3.5–5.0)
ALT: 13 U/L — ABNORMAL LOW (ref 14–54)
ANION GAP: 10 (ref 5–15)
AST: 21 U/L (ref 15–41)
Alkaline Phosphatase: 65 U/L (ref 38–126)
BILIRUBIN TOTAL: 0.3 mg/dL (ref 0.3–1.2)
BUN: 8 mg/dL (ref 6–20)
CO2: 30 mmol/L (ref 22–32)
Calcium: 9.3 mg/dL (ref 8.9–10.3)
Chloride: 100 mmol/L — ABNORMAL LOW (ref 101–111)
Creatinine, Ser: 0.85 mg/dL (ref 0.44–1.00)
GFR calc Af Amer: >60 mL/min (ref >60)
Glucose, Bld: 126 mg/dL — ABNORMAL HIGH (ref 65–99)
POTASSIUM: 3.5 mmol/L (ref 3.5–5.1)
Sodium: 140 mmol/L (ref 135–145)
TOTAL PROTEIN: 6.7 g/dL (ref 6.5–8.1)

## 2016-10-03 LAB — LIPASE, BLOOD: LIPASE: 29 U/L (ref 11–51)

## 2016-10-03 MED ORDER — SODIUM CHLORIDE 0.9 % IV BOLUS (SEPSIS)
1000.0000 mL | Freq: Once | INTRAVENOUS | Status: AC
  Administered 2016-10-03: 1000 mL via INTRAVENOUS

## 2016-10-03 MED ORDER — IOPAMIDOL (ISOVUE-300) INJECTION 61%
INTRAVENOUS | Status: AC
  Administered 2016-10-03: 100 mL
  Filled 2016-10-03: qty 100

## 2016-10-03 NOTE — ED Triage Notes (Signed)
Pt comes with list of complaints.  Most notably headaches, nausea and abdominal discomfort.  She was seen at another emergency dept and they suggested endoscopy.

## 2016-10-03 NOTE — ED Provider Notes (Signed)
[facility] DEPT Provider Note   CSN: [id number] Arrival date & time: 10/03/16  1200     History   Chief Complaint Chief Complaint  Patient presents with  . Headache  . Abdominal Pain    HPI Amanda Middleton is a 66 y.o. female.  Patient is a 66 year old female with past medical history of anxiety, depression, irritable bowel. She presents today for evaluation of abdominal pain and headache. She reports her head "begins to pounds" when she eats. She also reports that everything she eats "turns into gas". She has belching and flatus excessively with anything she eats. This is been ongoing for the past week. She was seen at the [facility] ER for palpitations several days ago, however no cause was found. She was told that she may need an endoscopy and presents here hoping to have this done. She also complains of multiple other complaints including dizziness, weakness, and fatigue.   The history is provided by the patient.    Past Medical History:  Diagnosis Date  . TIA (transient ischemic attack)     Patient Active Problem List   Diagnosis Date Noted  . PREMATURE VENTRICULAR CONTRACTIONS 05/15/2008  . PALPITATIONS 05/15/2008  . CHEST PAIN 05/15/2008  . HYPERLIPIDEMIA 09/02/2007  . Anxiety state 09/02/2007  . DEPRESSION 09/02/2007  . PNEUMONIA 09/02/2007  . IRRITABLE BOWEL SYNDROME 09/02/2007  . CHOLELITHIASIS 09/02/2007  . HEADACHE, CHRONIC 09/02/2007  . RHEUMATIC FEVER 09/01/2007  . NAUSEA 09/01/2007    Past Surgical History:  Procedure Laterality Date  . ABDOMINAL HYSTERECTOMY    . BREAST SURGERY    . CHOLECYSTECTOMY    . VASCULAR SURGERY     L carotid    OB History    No data available       Home Medications    Prior to Admission medications   Medication Sig Start Date End Date Taking? Authorizing Provider  amLODipine (NORVASC) 5 MG tablet Take 5 mg by mouth daily.    [provider]  aspirin EC 81 MG tablet Take 81 mg by mouth daily.     [provider]  benazepril-hydrochlorthiazide (LOTENSIN HCT) 20-25 MG tablet Take 1 tablet by mouth daily.    [provider]  cholecalciferol (VITAMIN D) 1000 UNITS tablet Take 1,000 Units by mouth daily.    [provider]  clonazePAM (KLONOPIN) 0.5 MG tablet Take 0.5 mg by mouth. 12/21/14   [provider]  gabapentin (NEURONTIN) 100 MG capsule Take 100 mg by mouth 2 (two) times daily.    [provider]  metoCLOPramide (REGLAN) 10 MG tablet Take 1 tablet (10 mg total) by mouth every 6 (six) hours. 12/24/14   Jola Schmidt, MD  metoprolol succinate (TOPROL-XL) 25 MG 24 hr tablet Take 25 mg by mouth daily.    [provider]  ondansetron (ZOFRAN-ODT) 4 MG disintegrating tablet  12/05/14   [provider]    Family History No family history on file.  Social History Social History  Substance Use Topics  . Smoking status: Never Smoker  . Smokeless tobacco: Never Used  . Alcohol use No     Allergies   Patient has no known allergies.   Review of Systems Review of Systems  All other systems reviewed and are negative.    Physical Exam Updated Vital Signs BP 131/88 (BP Location: Left Arm)   Pulse 75   Temp 99.5 F (37.5 C) (Oral)   Resp 17   Ht 5\' 7"  (1.702 m)  Wt 70 kg (154 lb 4 oz)   SpO2 95%   BMI 24.16 kg/m   Physical Exam  Constitutional: She is oriented to person, place, and time. She appears well-developed and well-nourished. No distress.  HENT:  Head: Normocephalic and atraumatic.  Mouth/Throat: Oropharynx is clear and moist.  Eyes: Pupils are equal, round, and reactive to light. EOM are normal.  Neck: Normal range of motion. Neck supple.  Cardiovascular: Normal rate and regular rhythm.  Exam reveals no gallop and no friction rub.   No murmur heard. Pulmonary/Chest: Effort normal and breath sounds normal. No respiratory distress. She has no wheezes.  Abdominal: Soft. Bowel sounds are normal. She  exhibits no distension. There is no tenderness.  Musculoskeletal: Normal range of motion.  Neurological: She is alert and oriented to person, place, and time. No cranial nerve deficit. She exhibits normal muscle tone. Coordination normal.  Skin: Skin is warm and dry. She is not diaphoretic.  Nursing note and vitals reviewed.    ED Treatments / Results  Labs (all labs ordered are listed, but only abnormal results are displayed) Labs Reviewed  COMPREHENSIVE METABOLIC PANEL - Abnormal; Notable for the following:       Result Value   Chloride 100 (*)    Glucose, Bld 126 (*)    ALT 13 (*)    All other components within normal limits  URINALYSIS, ROUTINE W REFLEX MICROSCOPIC - Abnormal; Notable for the following:    Color, Urine COLORLESS (*)    Specific Gravity, Urine 1.004 (*)    All other components within normal limits  LIPASE, BLOOD  CBC    EKG  EKG Interpretation None       Radiology No results found.  Procedures Procedures (including critical care time)  Medications Ordered in ED Medications  sodium chloride 0.9 % bolus 1,000 mL (not administered)     Initial Impression / Assessment and Plan / ED Course  I have reviewed the triage vital signs and the nursing notes.  Pertinent labs & imaging results that were available during my care of the patient were reviewed by me and considered in my medical decision making (see chart for details).  Patient presents with multiple complaints including headache, abdominal pain, distention, and increased belching and flatus. Her physical examination is essentially unremarkable. Her workup reveals no laboratory abnormalities and imaging studies are negative for acute process.  The patient has had a colonoscopy in June, however his never had an endoscopy. She was advised by her primary doctor to come here for these arrangements can be made. She will be discharged with instructions to follow-up with GI. She is to return as needed in  the meantime if symptoms worsen or change.  Final Clinical Impressions(s) / ED Diagnoses   Final diagnoses:  None    New Prescriptions New Prescriptions   No medications on file     Veryl Speak, MD 10/03/16 [phone]

## 2016-10-03 NOTE — ED Notes (Signed)
Patient transported to CT 

## 2016-10-03 NOTE — Discharge Instructions (Signed)
Follow-up with gastroenterology. The contact information for [facility] has been provided in this discharge summary for you to call and make these arrangements.

## 2016-10-22 ENCOUNTER — Telehealth: Payer: Self-pay | Admitting: Neurology

## 2016-10-22 NOTE — Telephone Encounter (Signed)
Rec'd from [facility] forwarded 1 page to Metta Clines R DO

## 2017-01-29 ENCOUNTER — Ambulatory Visit (INDEPENDENT_AMBULATORY_CARE_PROVIDER_SITE_OTHER): Payer: Medicare Other | Admitting: Neurology

## 2017-01-29 ENCOUNTER — Encounter: Payer: Self-pay | Admitting: Neurology

## 2017-01-29 VITALS — Ht 67.0 in | Wt 168.8 lb

## 2017-01-29 DIAGNOSIS — G3184 Mild cognitive impairment, so stated: Secondary | ICD-10-CM | POA: Diagnosis not present

## 2017-01-29 DIAGNOSIS — R1310 Dysphagia, unspecified: Secondary | ICD-10-CM | POA: Insufficient documentation

## 2017-01-29 NOTE — Progress Notes (Signed)
PATIENT: Amanda Middleton DOB: Sep 01, 1950  Chief Complaint  Patient presents with  . Dysphagia    Says she had previously had difficulty with swallowing and nausea but these symptoms have resolved.  Says she is concerned about a benign fatty tumor on the left side of her neck.  Marland Kitchen PCP    Rupert Stacks, MD     HISTORICAL  Amanda Middleton is a 66 year old female, seen in refer by her primary care doctor Rupert Stacks for evaluation of dysphagia, initial evaluation was on January 29, 2017.  I reviewed and summarized the referring note, she has past medical history of hypertension, anxiety, she is a poor historian,  Per patient, he had multiple left parotid gland surgery in the past, most recent one was 2011, she always felt mild swallowing at the left neck, left chin area, in April 2018, she developed mild swallowing difficulty, noticed swollen mass in her left neck, eventually had a CT scan of the neck in September 2018, which showed fat-containing mass in the left neck, lateral to the spine, she was later evaluated by ENT, needle aspiration result on November 23, 2016, predominantly adipocytes, blood, no atypia  or malignancy identified  Her difficulty swallowing has much improved since November 2018, she denies double vision, no limb muscle weakness,  She reported episode of nausea, which has improved as well, CT head without contrast in August 2018, minimum supratentorium small vessel disease.  Laboratory evaluations in October 2018, vitamin B12 499, negative RPR, CBC showed hemoglobin of 12.9, CMP showed creatinine of 0.83,  She also complains of mild memory loss, CT head without contrast in August 2018, mild supratentorium small vessel disease, no acute abnormality.  REVIEW OF SYSTEMS: Full 14 system review of systems performed and notable only for palpitation, incontinence, memory loss, insomnia  ALLERGIES: No Known Allergies  HOME MEDICATIONS: Current Outpatient Medications    Medication Sig Dispense Refill  . amLODipine (NORVASC) 10 MG tablet Take 10 mg by mouth daily.    . ARIPiprazole (ABILIFY) 10 MG tablet Take 10 mg by mouth daily.    Marland Kitchen aspirin EC 81 MG tablet Take 81 mg by mouth daily.    . cholecalciferol (VITAMIN D) 1000 UNITS tablet Take 1,000 Units by mouth daily.    Marland Kitchen gabapentin (NEURONTIN) 300 MG capsule Take 300 mg by mouth 4 (four) times daily.    . hydrochlorothiazide (HYDRODIURIL) 25 MG tablet Take 25 mg by mouth daily.    . propranolol ER (INDERAL LA) 60 MG 24 hr capsule Take 60 mg by mouth daily.    Marland Kitchen zolpidem (AMBIEN) 5 MG tablet Take 5 mg by mouth at bedtime.     No current facility-administered medications for this visit.     PAST MEDICAL HISTORY: Past Medical History:  Diagnosis Date  . Anxiety   . Aortic atherosclerosis ([place])   . Benign tumor    left breast, left side of neck  . Chronic insomnia   . Hypertension   . Osteopenia   . Peripheral neuropathy   . TIA (transient ischemic attack)     PAST SURGICAL HISTORY: Past Surgical History:  Procedure Laterality Date  . ABDOMINAL HYSTERECTOMY    . BREAST SURGERY    . CHOLECYSTECTOMY    . LEG SURGERY    . parotid gland    . VASCULAR SURGERY     L carotid    FAMILY HISTORY: Family History  Problem Relation Age of Onset  . Breast cancer Mother   .  Heart disease Father     SOCIAL HISTORY:  Social History   Socioeconomic History  . Marital status: Single    Spouse name: Not on file  . Number of children: 0  . Years of education: college  . Highest education level: Bachelor's degree (e.g., BA, AB, BS)  Social Needs  . Financial resource strain: Not on file  . Food insecurity - worry: Not on file  . Food insecurity - inability: Not on file  . Transportation needs - medical: Not on file  . Transportation needs - non-medical: Not on file  Occupational History  . Occupation: Retired  Tobacco Use  . Smoking status: Never Smoker  . Smokeless tobacco: Never Used   Substance and Sexual Activity  . Alcohol use: No  . Drug use: No  . Sexual activity: Not on file  Other Topics Concern  . Not on file  Social History Narrative   Lives at home alone.   Right-handed.   1 cup caffeine on some days.     PHYSICAL EXAM   Vitals:   01/29/17 1152  Weight: 168 lb 12 oz (76.5 kg)  Height: 5\' 7"  (1.702 m)    Not recorded      Body mass index is 26.43 kg/m.  PHYSICAL EXAMNIATION:  Gen: NAD, conversant, well nourised, obese, well groomed                     Cardiovascular: Regular rate rhythm, no peripheral edema, warm, nontender. Eyes: Conjunctivae clear without exudates or hemorrhage Neck: Supple, no carotid bruits. Pulmonary: Clear to auscultation bilaterally   NEUROLOGICAL EXAM:  MENTAL STATUS: Speech:    Speech is normal; fluent and spontaneous with normal comprehension.  Cognition:     Orientation to time, place and person     Normal recent and remote memory     Normal Attention span and concentration     Normal Language, naming, repeating,spontaneous speech     Fund of knowledge   CRANIAL NERVES: CN II: Visual fields are full to confrontation. Fundoscopic exam is normal with sharp discs and no vascular changes. Pupils are round equal and briskly reactive to light. CN III, IV, VI: extraocular movement are normal. No ptosis. CN V: Facial sensation is intact to pinprick in all 3 divisions bilaterally. Corneal responses are intact.  CN VII: Face is symmetric with normal eye closure and smile. CN VIII: Hearing is normal to rubbing fingers CN IX, X: Palate elevates symmetrically. Phonation is normal. CN XI: Head turning and shoulder shrug are intact CN XII: Tongue is midline with normal movements and no atrophy.  MOTOR: There is no pronator drift of out-stretched arms. Muscle bulk and tone are normal. Muscle strength is normal.  REFLEXES: Reflexes are 2+ and symmetric at the biceps, triceps, knees, and ankles. Plantar responses are  flexor.  SENSORY: Intact to light touch, pinprick, positional sensation and vibratory sensation are intact in fingers and toes.  COORDINATION: Rapid alternating movements and fine finger movements are intact. There is no dysmetria on finger-to-nose and heel-knee-shin.    GAIT/STANCE: Cautious, mildly unsteady   DIAGNOSTIC DATA (LABS, IMAGING, TESTING) - I reviewed patient records, labs, notes, testing and imaging myself where available.   ASSESSMENT AND PLAN  Amanda Middleton is a 66 y.o. female    Reported dysphagia episodes  Need to rule out neuromuscular junctional disorder  Proceed with acetylcholine receptor antibody  Overall her symptoms has improved,     Marcial Pacas, M.D. Ph.D.  [facility] [address], [place], [place] [address] Ph: [phone] Fax: [phone]  CC: Rupert Stacks, MD

## 2017-02-07 ENCOUNTER — Telehealth: Payer: Self-pay | Admitting: *Deleted

## 2017-02-07 LAB — CK: CK TOTAL: 59 U/L (ref 24–173)

## 2017-02-07 LAB — ACETYLCHOLINE RECEPTOR AB, ALL: Acetylchol Block Ab: 16 % (ref 0–25)

## 2017-02-07 LAB — VITAMIN B12: Vitamin B-12: 477 pg/mL (ref 232–1245)

## 2017-02-07 LAB — TSH: TSH: 1.03 u[IU]/mL (ref 0.450–4.500)

## 2017-02-07 NOTE — Telephone Encounter (Signed)
Spoke to patient she is aware of results

## 2017-02-07 NOTE — Telephone Encounter (Signed)
-----   Message from Larey Seat, MD sent at 02/07/2017  2:01 PM EST ----- Negative for  acetycholinergic AB , normal B 12 and TSH

## 2017-02-19 ENCOUNTER — Other Ambulatory Visit (HOSPITAL_COMMUNITY): Payer: Self-pay | Admitting: Family Medicine

## 2017-02-19 DIAGNOSIS — R221 Localized swelling, mass and lump, neck: Secondary | ICD-10-CM

## 2017-03-04 ENCOUNTER — Ambulatory Visit (HOSPITAL_COMMUNITY): Payer: Medicare Other

## 2017-03-12 ENCOUNTER — Ambulatory Visit (HOSPITAL_COMMUNITY): Payer: Medicare Other

## 2018-11-04 ENCOUNTER — Other Ambulatory Visit: Payer: Self-pay | Admitting: Physician Assistant

## 2018-11-04 DIAGNOSIS — N6320 Unspecified lump in the left breast, unspecified quadrant: Secondary | ICD-10-CM

## 2018-11-06 ENCOUNTER — Other Ambulatory Visit: Payer: Self-pay | Admitting: Physician Assistant

## 2018-11-06 DIAGNOSIS — N6320 Unspecified lump in the left breast, unspecified quadrant: Secondary | ICD-10-CM

## 2018-11-28 ENCOUNTER — Ambulatory Visit
Admission: RE | Admit: 2018-11-28 | Discharge: 2018-11-28 | Disposition: A | Discharge status: Home / Self Care | Payer: Medicare Other | Source: Ambulatory Visit | Attending: Physician Assistant | Admitting: Physician Assistant

## 2018-11-28 DIAGNOSIS — N6320 Unspecified lump in the left breast, unspecified quadrant: Secondary | ICD-10-CM

## 2018-11-28 DIAGNOSIS — N6002 Solitary cyst of left breast: Secondary | ICD-10-CM | POA: Diagnosis not present

## 2019-12-28 ENCOUNTER — Ambulatory Visit (INDEPENDENT_AMBULATORY_CARE_PROVIDER_SITE_OTHER): Payer: Medicare Other | Admitting: Internal Medicine

## 2019-12-28 VITALS — BP 147/75 | HR 81 | Temp 98.6°F | Resp 14 | Ht 67.0 in | Wt 174.0 lb

## 2019-12-28 DIAGNOSIS — I10 Essential (primary) hypertension: Secondary | ICD-10-CM | POA: Diagnosis not present

## 2019-12-28 DIAGNOSIS — Z9989 Dependence on other enabling machines and devices: Secondary | ICD-10-CM | POA: Diagnosis not present

## 2019-12-28 DIAGNOSIS — G4733 Obstructive sleep apnea (adult) (pediatric): Secondary | ICD-10-CM

## 2019-12-28 NOTE — Patient Instructions (Signed)

## 2019-12-28 NOTE — Progress Notes (Signed)
[facility] [place], [place] [address]  Pulmonary Sleep Medicine   Office Visit Note  Patient Name: Amanda Middleton DOB: [birth year] MRN [id number]    Chief Complaint: Obstructive Sleep Apnea visit  Brief History:  Amanda Middleton is seen today for follow up The patient has a 3 year history of sleep apnea. Patient is using PAP nightly.  The patient feels more rested after sleeping with PAP.  She has nights when she doesn't sleep well especially when she has not been very active.The patient reports benefitin from PAP use. Reported sleepiness is  Slightly worse and the Epworth Sleepiness Score is 9 out of 24. The patient does not take naps but is nodding off more.. The patient complains of the following: dryness  The compliance download shows excellentcompliance with an average use time of 7:18 hours. The AHI is 3.6  The patient does not complain of limb movements disrupting sleep.  ROS  General: (-) fever, (-) chills, (-) night sweat Nose and Sinuses: (-) nasal stuffiness or itchiness, (-) postnasal drip, (-) nosebleeds, (-) sinus trouble. Mouth and Throat: (-) sore throat, (-) hoarseness. Neck: (-) swollen glands, (-) enlarged thyroid, (-) neck pain. Respiratory: - cough, - shortness of breath, - wheezing. Neurologic: - numbness, - tingling. Psychiatric: - anxiety, - depression   Current Medication: Outpatient Encounter Medications as of 12/28/2019  Medication Sig  . alendronate (FOSAMAX) 70 MG tablet Take 70 mg by mouth once a week.  Marland Kitchen amLODipine (NORVASC) 10 MG tablet Take 10 mg by mouth daily.  Marland Kitchen amLODipine (NORVASC) 5 MG tablet Take 5 mg by mouth daily.  Marland Kitchen aspirin EC 81 MG tablet Take 81 mg by mouth daily.  . cholecalciferol (VITAMIN D) 1000 UNITS tablet Take 1,000 Units by mouth daily.  Marland Kitchen gabapentin (NEURONTIN) 300 MG capsule Take 300 mg by mouth 4 (four) times daily.  . hydrochlorothiazide (HYDRODIURIL) 25 MG tablet Take 25 mg by mouth daily.  . propranolol  ER (INDERAL LA) 60 MG 24 hr capsule Take 60 mg by mouth daily.  . [DISCONTINUED] ARIPiprazole (ABILIFY) 10 MG tablet Take 10 mg by mouth daily.  . [DISCONTINUED] zolpidem (AMBIEN) 5 MG tablet Take 5 mg by mouth at bedtime.   No facility-administered encounter medications on file as of 12/28/2019.    Surgical History: Past Surgical History:  Procedure Laterality Date  . ABDOMINAL HYSTERECTOMY    . BREAST SURGERY    . CHOLECYSTECTOMY    . LEG SURGERY    . parotid gland    . VASCULAR SURGERY     L carotid    Medical History: Past Medical History:  Diagnosis Date  . Anxiety   . Aortic atherosclerosis ([place])   . Benign tumor    left breast, left side of neck  . Chronic insomnia   . Hypertension   . Osteopenia   . Peripheral neuropathy   . TIA (transient ischemic attack)     Family History: Non contributory to the present illness  Social History: Social History   Socioeconomic History  . Marital status: Single    Spouse name: Not on file  . Number of children: 0  . Years of education: college  . Highest education level: Bachelor's degree (e.g., BA, AB, BS)  Occupational History  . Occupation: Retired  Tobacco Use  . Smoking status: Never Smoker  . Smokeless tobacco: Never Used  Vaping Use  . Vaping Use: Never used  Substance and Sexual Activity  . Alcohol use: No  .  Drug use: No  . Sexual activity: Not on file  Other Topics Concern  . Not on file  Social History Narrative   Lives at home alone.   Right-handed.   1 cup caffeine on some days.   Social Determinants of Health   Financial Resource Strain:   . Difficulty of Paying Living Expenses: Not on file  Food Insecurity:   . Worried About Charity fundraiser in the Last Year: Not on file  . Ran Out of Food in the Last Year: Not on file  Transportation Needs:   . Lack of Transportation (Medical): Not on file  . Lack of Transportation (Non-Medical): Not on file  Physical Activity:   . Days of Exercise  per Week: Not on file  . Minutes of Exercise per Session: Not on file  Stress:   . Feeling of Stress : Not on file  Social Connections:   . Frequency of Communication with Friends and Family: Not on file  . Frequency of Social Gatherings with Friends and Family: Not on file  . Attends Religious Services: Not on file  . Active Member of Clubs or Organizations: Not on file  . Attends Archivist Meetings: Not on file  . Marital Status: Not on file  Intimate Partner Violence:   . Fear of Current or Ex-Partner: Not on file  . Emotionally Abused: Not on file  . Physically Abused: Not on file  . Sexually Abused: Not on file    Vital Signs: Blood pressure (!) 147/75, pulse 81, temperature 98.6 F (37 C), temperature source Temporal, resp. rate 14, height 5\' 7"  (1.702 m), weight 174 lb (78.9 kg), SpO2 98 %.  Examination: General Appearance: The patient is well-developed, well-nourished, and in no distress. Neck Circumference: 37 Skin: Gross inspection of skin unremarkable. Head: normocephalic, no gross deformities. Eyes: no gross deformities noted. ENT: ears appear grossly normal Neurologic: Alert and oriented. No involuntary movements.    EPWORTH SLEEPINESS SCALE:  Scale:  (0)= no chance of dozing; (1)= slight chance of dozing; (2)= moderate chance of dozing; (3)= high chance of dozing  Chance  Situtation    Sitting and reading: 2    Watching TV: 0    Sitting Inactive in public: 0    As a passenger in car: 3      Lying down to rest: 2    Sitting and talking: 0    Sitting quielty after lunch: 2    In a car, stopped in traffic: 0   TOTAL SCORE:   9 out of 24    SLEEP STUDIES:  1. PSG on 07/29/17 -AHI 21.4,  Lowest saturation 72%   CPAP COMPLIANCE DATA:  Date Range: 12/23/18 - 12/22/19   Average Daily Use: 7:18 hours  Median Use: 7:22  Compliance for > 4 Hours: 98%  AHI: 3.6 respiratory events per hour  Days Used: 365/365  Mask Leak: 29.9  lpm  95th Percentile Pressure: 3.6         LABS: No results found for this or any previous visit (from the past 2160 hour(s)).  Radiology: US BREAST LTD UNI LEFT INC AXILLA  Result Date: 11/28/2018 CLINICAL DATA:  Patient presents with a painful/palpable area in the left axillary region that was previously imaged. Recommendation was for surgical/dermatological consultation to consider excision. Patient states it was excised in November 2019 but records are not available at this time. EXAM: DIGITAL DIAGNOSTIC BILATERAL MAMMOGRAM WITH CAD AND TOMO ULTRASOUND LEFT BREAST COMPARISON:  Previous exam(s). ACR Breast Density Category b: There are scattered areas of fibroglandular density. FINDINGS: Mammogram: Right breast: No suspicious mass, distortion, or microcalcifications are identified to suggest presence of malignancy. Left breast: Additional spot tangential views were performed at the painful/palpable site in the left axillary tail demonstrating a superficial 0.6 cm mass. No other suspicious mass, distortion, or microcalcifications are identified to suggest presence of malignancy in the left breast. Mammographic images were processed with CAD. On physical exam, I palpate a small skin nodule at the painful/palpable site of concern in the left axillary region. There is no overlying skin erythema or breakdown. Ultrasound: Targeted ultrasound is performed in the left axilla demonstrating an oval circumscribed anechoic mass with some internal debris measuring 0.6 x 0.5 x 0.5 cm. No definite tract is identified to the skin on today's exam. No internal blood flow demonstrated. This most recently measured 0.4 x 0.4 x 0.5 cm, however measured up to 0.7 cm in 2012. IMPRESSION: 1. At the palpable/painful site of concern in the left axillary tail there is a superficial cystic mass measuring 0.6 cm, slightly increased in size compared to ultrasound from June 2020. The mass most likely represents a benign  sebaceous cyst though a definite tract to the skin could not be identified today. No evidence of infection. 2.  No mammographic evidence of malignancy in the bilateral breasts. RECOMMENDATION: 1. Given that the left axillary tail mass, likely representing a sebaceous cyst, is symptomatic, recommend dermatologic or surgical consultation to consider excision. 2.  Screening mammogram in one year.(Code:SM-B-01Y) I have discussed the findings and recommendations with the patient. If applicable, a reminder letter will be sent to the patient regarding the next appointment. BI-RADS CATEGORY  2: Benign. Electronically Signed   By: Audie Pinto M.D.   On: 11/28/2018 15:29   MM DIAG BREAST TOMO UNI LEFT  Result Date: 11/28/2018 CLINICAL DATA:  Patient presents with a painful/palpable area in the left axillary region that was previously imaged. Recommendation was for surgical/dermatological consultation to consider excision. Patient states it was excised in November 2019 but records are not available at this time. EXAM: DIGITAL DIAGNOSTIC BILATERAL MAMMOGRAM WITH CAD AND TOMO ULTRASOUND LEFT BREAST COMPARISON:  Previous exam(s). ACR Breast Density Category b: There are scattered areas of fibroglandular density. FINDINGS: Mammogram: Right breast: No suspicious mass, distortion, or microcalcifications are identified to suggest presence of malignancy. Left breast: Additional spot tangential views were performed at the painful/palpable site in the left axillary tail demonstrating a superficial 0.6 cm mass. No other suspicious mass, distortion, or microcalcifications are identified to suggest presence of malignancy in the left breast. Mammographic images were processed with CAD. On physical exam, I palpate a small skin nodule at the painful/palpable site of concern in the left axillary region. There is no overlying skin erythema or breakdown. Ultrasound: Targeted ultrasound is performed in the left axilla demonstrating an  oval circumscribed anechoic mass with some internal debris measuring 0.6 x 0.5 x 0.5 cm. No definite tract is identified to the skin on today's exam. No internal blood flow demonstrated. This most recently measured 0.4 x 0.4 x 0.5 cm, however measured up to 0.7 cm in 2012. IMPRESSION: 1. At the palpable/painful site of concern in the left axillary tail there is a superficial cystic mass measuring 0.6 cm, slightly increased in size compared to ultrasound from June 2020. The mass most likely represents a benign sebaceous cyst though a definite tract to the skin could not be identified today. No evidence  of infection. 2.  No mammographic evidence of malignancy in the bilateral breasts. RECOMMENDATION: 1. Given that the left axillary tail mass, likely representing a sebaceous cyst, is symptomatic, recommend dermatologic or surgical consultation to consider excision. 2.  Screening mammogram in one year.(Code:SM-B-01Y) I have discussed the findings and recommendations with the patient. If applicable, a reminder letter will be sent to the patient regarding the next appointment. BI-RADS CATEGORY  2: Benign. Electronically Signed   By: Audie Pinto M.D.   On: 11/28/2018 15:29    No results found.  No results found.    Assessment and Plan: Patient Active Problem List   Diagnosis Date Noted  . Mild cognitive impairment 01/29/2017  . Dysphagia 01/29/2017  . Essential hypertension 09/21/2013  . PREMATURE VENTRICULAR CONTRACTIONS 05/15/2008  . PALPITATIONS 05/15/2008  . CHEST PAIN 05/15/2008  . HYPERLIPIDEMIA 09/02/2007  . Anxiety state 09/02/2007  . DEPRESSION 09/02/2007  . PNEUMONIA 09/02/2007  . IRRITABLE BOWEL SYNDROME 09/02/2007  . CHOLELITHIASIS 09/02/2007  . HEADACHE, CHRONIC 09/02/2007  . RHEUMATIC FEVER 09/01/2007  . NAUSEA 09/01/2007      The patient does tolerate PAP and reports significant benefit from PAP use. The patient was reminded that she is to use a positional device to  prevent sleeping in the supine position. She requires significantly more  Pressure in this position. She will look into getting a positional device. The patient was also counselled on increasing her daily activity. The compliance is excellent. The AHI is in the acceptable range but variable likely due to positional effects..She will get a device and let us know when she has used it for 2 weeks so the download can be checked.   1. OSA- continue excellent compliance. Will get download 2 weeks after she starts using the positional device. 2. HTN BP elevated will need follow up with pcp 3. Obesity diet adjustment  Obesity Counseling: Risk Assessment: An assessment of behavioral risk factors was made today and includes lack of exercise sedentary lifestyle, lack of portion control and poor dietary habits.  Risk Modification Advice: She was counseled on portion control guidelines. Restricting daily caloric intake to. . The detrimental long term effects of obesity on her health and ongoing poor compliance was also discussed with the patient.    General Counseling: I have discussed the findings of the evaluation and examination with Amanda Middleton.  I have also discussed any further diagnostic evaluation thatmay be needed or ordered today. Amanda Middleton verbalizes understanding of the findings of todays visit. We also reviewed her medications today and discussed drug interactions and side effects including but not limited excessive drowsiness and altered mental states. We also discussed that there is always a risk not just to her but also people around her. she has been encouraged to call the office with any questions or concerns that should arise related to todays visit.  No orders of the defined types were placed in this encounter.       I have personally obtained a history, examined the patient, evaluated laboratory and imaging results, formulated the assessment and plan and placed orders.   Richelle Ito Saunders Glance, PhD,  FAASM  Diplomate, American Board of Sleep Medicine    Allyne Gee, MD [facility] Diplomate ABMS Pulmonary and Critical Care Medicine Sleep medicine

## 2021-01-16 ENCOUNTER — Ambulatory Visit (INDEPENDENT_AMBULATORY_CARE_PROVIDER_SITE_OTHER): Payer: Medicare Other | Admitting: Internal Medicine

## 2021-01-16 VITALS — BP 125/74 | HR 68 | Resp 20 | Ht 67.0 in | Wt 175.3 lb

## 2021-01-16 DIAGNOSIS — G4733 Obstructive sleep apnea (adult) (pediatric): Secondary | ICD-10-CM | POA: Diagnosis not present

## 2021-01-16 DIAGNOSIS — Z9989 Dependence on other enabling machines and devices: Secondary | ICD-10-CM | POA: Diagnosis not present

## 2021-01-16 DIAGNOSIS — G4700 Insomnia, unspecified: Secondary | ICD-10-CM

## 2021-01-16 DIAGNOSIS — Z7189 Other specified counseling: Secondary | ICD-10-CM | POA: Diagnosis not present

## 2021-01-16 NOTE — Progress Notes (Signed)
[facility] [place], [place] [address]  Pulmonary Sleep Medicine   Office Visit Note  Patient Name: Janeane Cozart DOB: 01/09/1951 MRN [id number]    Chief Complaint: Obstructive Sleep Apnea visit  Brief History:  Krisinda is seen today for annual follow up. The patient has a 4 year history of sleep apnea. Patient using PAP nightly.  The patient feels  sleepy after sleeping with PAP.  The patient reports not benefiting from PAP use. Epworth Sleepiness Score is 5 out of 24.  The patient complains of the following: not sleeping at night stating she has tried everything.  She said she falls asleep easily napping sporadically throughout  the day  from a few to 30 minutes just lays there and eventually gets up.  She maybe gets 2 - 3 nights good sleep. Said her Dr suggested she have another sleep study.The compliance download shows  compliance with an average use time of 7:28 hours @ 98%. The AHI is 3.5  The patient does not complain of limb movements disrupting sleep.  ROS  General: (-) fever, (-) chills, (-) night sweat Nose and Sinuses: (-) nasal stuffiness or itchiness, (-) postnasal drip, (-) nosebleeds, (-) sinus trouble. Mouth and Throat: (-) sore throat, (-) hoarseness. Neck: (-) swollen glands, (-) enlarged thyroid, (-) neck pain. Respiratory: - cough, - shortness of breath, - wheezing. Neurologic: - numbness, - tingling. Psychiatric: + anxiety, - depression   Current Medication: Outpatient Encounter Medications as of 01/16/2021  Medication Sig   alendronate (FOSAMAX) 70 MG tablet Take 70 mg by mouth once a week.   amLODipine (NORVASC) 10 MG tablet Take 10 mg by mouth daily.   amLODipine (NORVASC) 5 MG tablet Take 5 mg by mouth daily.   aspirin EC 81 MG tablet Take 81 mg by mouth daily.   cholecalciferol (VITAMIN D) 1000 UNITS tablet Take 1,000 Units by mouth daily.   gabapentin (NEURONTIN) 300 MG capsule Take 300 mg by mouth 4 (four) times daily.    hydrochlorothiazide (HYDRODIURIL) 25 MG tablet Take 25 mg by mouth daily.   propranolol ER (INDERAL LA) 60 MG 24 hr capsule Take 60 mg by mouth daily.   No facility-administered encounter medications on file as of 01/16/2021.    Surgical History: Past Surgical History:  Procedure Laterality Date   ABDOMINAL HYSTERECTOMY     BREAST SURGERY     CHOLECYSTECTOMY     LEG SURGERY     parotid gland     VASCULAR SURGERY     L carotid    Medical History: Past Medical History:  Diagnosis Date   Anxiety    Aortic atherosclerosis (HCC)    Benign tumor    left breast, left side of neck   Chronic insomnia    Hypertension    Osteopenia    Peripheral neuropathy    TIA (transient ischemic attack)     Family History: Non contributory to the present illness  Social History: Social History   Socioeconomic History   Marital status: Single    Spouse name: Not on file   Number of children: 0   Years of education: college   Highest education level: Bachelor's degree (e.g., BA, AB, BS)  Occupational History   Occupation: Retired  Tobacco Use   Smoking status: Never   Smokeless tobacco: Never  Vaping Use   Vaping Use: Never used  Substance and Sexual Activity   Alcohol use: No   Drug use: No   Sexual activity: Not  on file  Other Topics Concern   Not on file  Social History Narrative   Lives at home alone.   Right-handed.   1 cup caffeine on some days.   Social Determinants of Health   Financial Resource Strain: Not on file  Food Insecurity: Not on file  Transportation Needs: Not on file  Physical Activity: Not on file  Stress: Not on file  Social Connections: Not on file  Intimate Partner Violence: Not on file    Vital Signs: There were no vitals taken for this visit. There is no height or weight on file to calculate BMI.    Examination: General Appearance: The patient is well-developed, well-nourished, and in no distress. Neck Circumference: 37 cm, Skin: Gross  inspection of skin unremarkable. Head: normocephalic, no gross deformities. Eyes: no gross deformities noted. ENT: ears appear grossly normal Neurologic: Alert and oriented. No involuntary movements.    EPWORTH SLEEPINESS SCALE:  Scale:  (0)= no chance of dozing; (1)= slight chance of dozing; (2)= moderate chance of dozing; (3)= high chance of dozing  Chance  Situtation    Sitting and reading: 2    Watching TV: 0    Sitting Inactive in public: 0    As a passenger in car: 0      Lying down to rest: 1    Sitting and talking: 0    Sitting quielty after lunch: 2    In a car, stopped in traffic: 0   TOTAL SCORE:   5 out of 24    SLEEP STUDIES:  PSG on 07/29/17 -AHI 21.4,  Lowest saturation 72%   CPAP COMPLIANCE DATA:  Date Range: 01/12/20 - 01/10/21  Average Daily Use: 7:28 hours  Median Use: 7:31 hours  Compliance for > 4 Hours: 98% days  AHI: 3.5 respiratory events per hour  Days Used: 364/365  Mask Leak: 29.7lpm  95th Percentile Pressure: 9 cmH2O         LABS: No results found for this or any previous visit (from the past 2160 hour(s)).  Radiology: US BREAST LTD UNI LEFT INC AXILLA  Result Date: 11/28/2018 CLINICAL DATA:  Patient presents with a painful/palpable area in the left axillary region that was previously imaged. Recommendation was for surgical/dermatological consultation to consider excision. Patient states it was excised in November 2019 but records are not available at this time. EXAM: DIGITAL DIAGNOSTIC BILATERAL MAMMOGRAM WITH CAD AND TOMO ULTRASOUND LEFT BREAST COMPARISON:  Previous exam(s). ACR Breast Density Category b: There are scattered areas of fibroglandular density. FINDINGS: Mammogram: Right breast: No suspicious mass, distortion, or microcalcifications are identified to suggest presence of malignancy. Left breast: Additional spot tangential views were performed at the painful/palpable site in the left axillary tail  demonstrating a superficial 0.6 cm mass. No other suspicious mass, distortion, or microcalcifications are identified to suggest presence of malignancy in the left breast. Mammographic images were processed with CAD. On physical exam, I palpate a small skin nodule at the painful/palpable site of concern in the left axillary region. There is no overlying skin erythema or breakdown. Ultrasound: Targeted ultrasound is performed in the left axilla demonstrating an oval circumscribed anechoic mass with some internal debris measuring 0.6 x 0.5 x 0.5 cm. No definite tract is identified to the skin on today's exam. No internal blood flow demonstrated. This most recently measured 0.4 x 0.4 x 0.5 cm, however measured up to 0.7 cm in 2012. IMPRESSION: 1. At the palpable/painful site of concern in the left axillary tail  there is a superficial cystic mass measuring 0.6 cm, slightly increased in size compared to ultrasound from June 2020. The mass most likely represents a benign sebaceous cyst though a definite tract to the skin could not be identified today. No evidence of infection. 2.  No mammographic evidence of malignancy in the bilateral breasts. RECOMMENDATION: 1. Given that the left axillary tail mass, likely representing a sebaceous cyst, is symptomatic, recommend dermatologic or surgical consultation to consider excision. 2.  Screening mammogram in one year.(Code:SM-B-01Y) I have discussed the findings and recommendations with the patient. If applicable, a reminder letter will be sent to the patient regarding the next appointment. BI-RADS CATEGORY  2: Benign. Electronically Signed   By: Audie Pinto M.D.   On: 11/28/2018 15:29   MM DIAG BREAST TOMO UNI LEFT  Result Date: 11/28/2018 CLINICAL DATA:  Patient presents with a painful/palpable area in the left axillary region that was previously imaged. Recommendation was for surgical/dermatological consultation to consider excision. Patient states it was excised in  November 2019 but records are not available at this time. EXAM: DIGITAL DIAGNOSTIC BILATERAL MAMMOGRAM WITH CAD AND TOMO ULTRASOUND LEFT BREAST COMPARISON:  Previous exam(s). ACR Breast Density Category b: There are scattered areas of fibroglandular density. FINDINGS: Mammogram: Right breast: No suspicious mass, distortion, or microcalcifications are identified to suggest presence of malignancy. Left breast: Additional spot tangential views were performed at the painful/palpable site in the left axillary tail demonstrating a superficial 0.6 cm mass. No other suspicious mass, distortion, or microcalcifications are identified to suggest presence of malignancy in the left breast. Mammographic images were processed with CAD. On physical exam, I palpate a small skin nodule at the painful/palpable site of concern in the left axillary region. There is no overlying skin erythema or breakdown. Ultrasound: Targeted ultrasound is performed in the left axilla demonstrating an oval circumscribed anechoic mass with some internal debris measuring 0.6 x 0.5 x 0.5 cm. No definite tract is identified to the skin on today's exam. No internal blood flow demonstrated. This most recently measured 0.4 x 0.4 x 0.5 cm, however measured up to 0.7 cm in 2012. IMPRESSION: 1. At the palpable/painful site of concern in the left axillary tail there is a superficial cystic mass measuring 0.6 cm, slightly increased in size compared to ultrasound from June 2020. The mass most likely represents a benign sebaceous cyst though a definite tract to the skin could not be identified today. No evidence of infection. 2.  No mammographic evidence of malignancy in the bilateral breasts. RECOMMENDATION: 1. Given that the left axillary tail mass, likely representing a sebaceous cyst, is symptomatic, recommend dermatologic or surgical consultation to consider excision. 2.  Screening mammogram in one year.(Code:SM-B-01Y) I have discussed the findings and  recommendations with the patient. If applicable, a reminder letter will be sent to the patient regarding the next appointment. BI-RADS CATEGORY  2: Benign. Electronically Signed   By: Audie Pinto M.D.   On: 11/28/2018 15:29    No results found.  No results found.    Assessment and Plan: Patient Active Problem List   Diagnosis Date Noted   Mild cognitive impairment 01/29/2017   Dysphagia 01/29/2017   Essential hypertension 09/21/2013   PREMATURE VENTRICULAR CONTRACTIONS 05/15/2008   PALPITATIONS 05/15/2008   CHEST PAIN 05/15/2008   HYPERLIPIDEMIA 09/02/2007   Anxiety state 09/02/2007   DEPRESSION 09/02/2007   PNEUMONIA 09/02/2007   IRRITABLE BOWEL SYNDROME 09/02/2007   CHOLELITHIASIS 09/02/2007   HEADACHE, CHRONIC 09/02/2007   RHEUMATIC FEVER  09/01/2007   NAUSEA 09/01/2007    1. OSA on CPAP The patient does tolerate PAP and reports  benefit from PAP use. Despite her telling me that her doctor wanted her to get a sleep study, she is not interested. The patient was reminded how to clean equipment  and advised to replace supplies routinely. The patient was also counselled on sleep hygiene. The compliance is excellent. The AHI is 3.5.   OSA- continue with excellent compliance with pap. F/u one year.    2. CPAP use counseling CPAP Counseling: had a lengthy discussion with the patient regarding the importance of PAP therapy in management of the sleep apnea. Patient appears to understand the risk factor reduction and also understands the risks associated with untreated sleep apnea. Patient will try to make a good faith effort to remain compliant with therapy. Also instructed the patient on proper cleaning of the device including the water must be changed daily if possible and use of distilled water is preferred. Patient understands that the machine should be regularly cleaned with appropriate recommended cleaning solutions that do not damage the PAP machine for example given white  vinegar and water rinses. Other methods such as ozone treatment may not be as good as these simple methods to achieve cleaning.   3. Insomnia, unspecified type Discussed sleep hygiene. She is not interested in any further evaluation/treatment.    General Counseling: I have discussed the findings of the evaluation and examination with Kenneisha.  I have also discussed any further diagnostic evaluation thatmay be needed or ordered today. Alexiz verbalizes understanding of the findings of todays visit. We also reviewed her medications today and discussed drug interactions and side effects including but not limited excessive drowsiness and altered mental states. We also discussed that there is always a risk not just to her but also people around her. she has been encouraged to call the office with any questions or concerns that should arise related to todays visit.  No orders of the defined types were placed in this encounter.       I have personally obtained a history, examined the patient, evaluated laboratory and imaging results, formulated the assessment and plan and placed orders. This patient was seen today by Tressie Ellis, PA-C in collaboration with Dr. Devona Konig.   Allyne Gee, MD [facility] Diplomate ABMS Pulmonary Critical Care Medicine and Sleep Medicine

## 2021-01-16 NOTE — Patient Instructions (Signed)

## 2021-06-21 IMAGING — US US BREAST*L* LIMITED INC AXILLA
2 series · 8 of 8 positions shown · non-contrast
Comparison: Previous exam(s).

CLINICAL DATA: Patient presents with a painful/palpable area in the
left axillary region that was previously imaged. Recommendation was
for surgical/dermatological consultation to consider excision.
Patient states it was excised in December 2017 but records are not
available at this time.

EXAM:
DIGITAL DIAGNOSTIC BILATERAL MAMMOGRAM WITH CAD AND TOMO
ULTRASOUND LEFT BREAST

[Series 1: us breast*left* limited inc axilla · 0.04mm/px · 6 of 6 slices shown (1 of 2)]
[im 1/6]
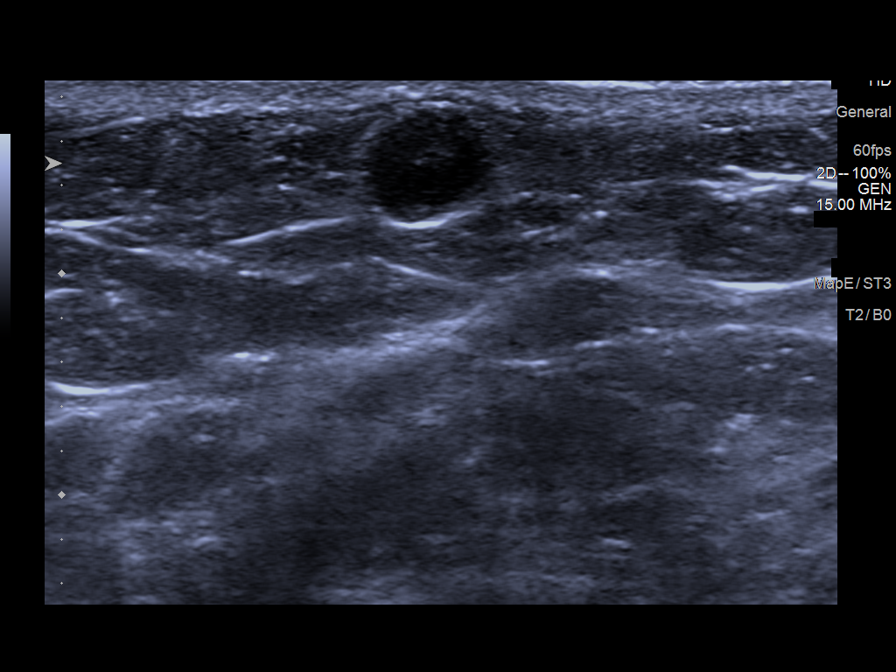
[im 2/6]
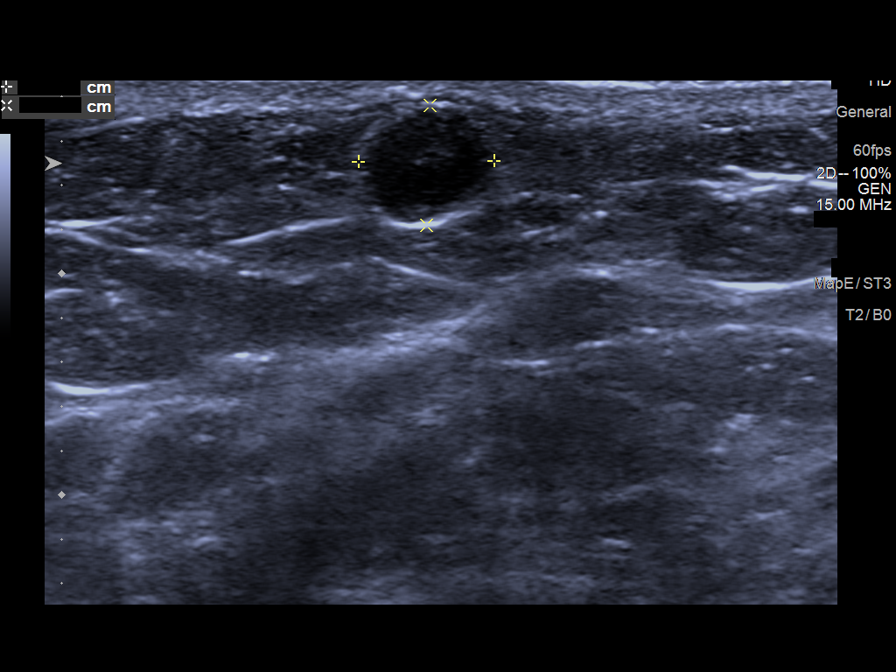
[im 3/6]
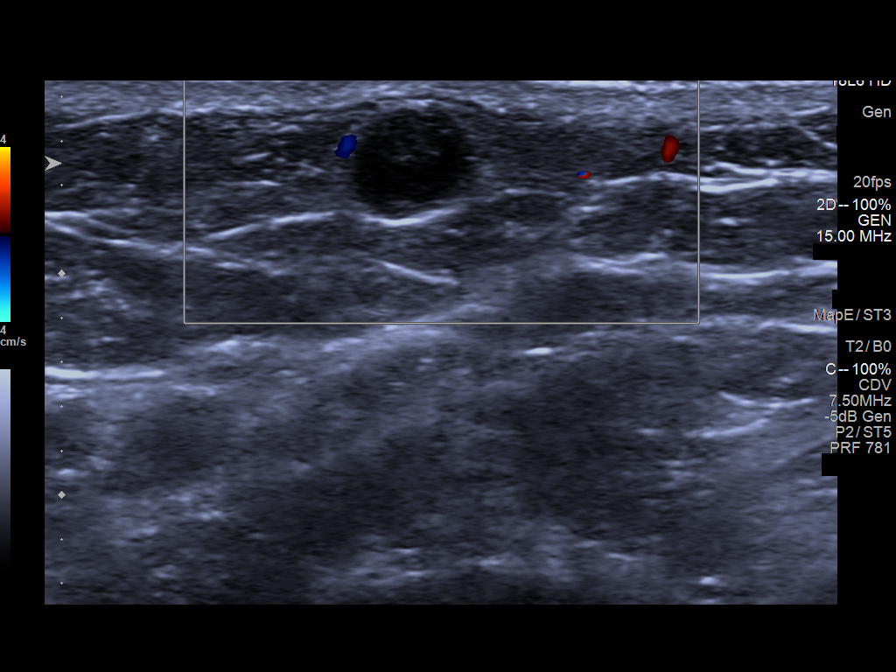
[im 4/6]
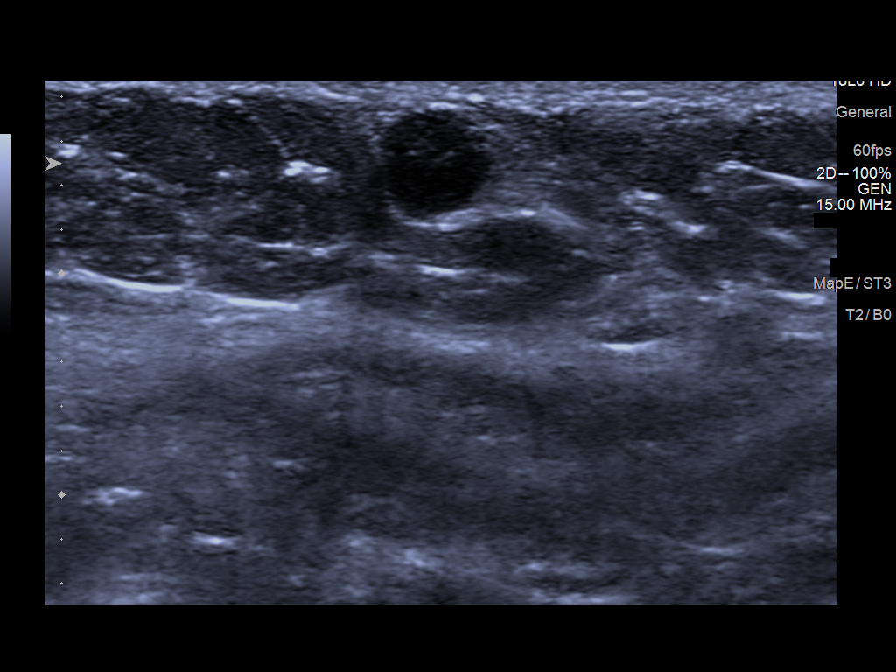
[im 5/6]
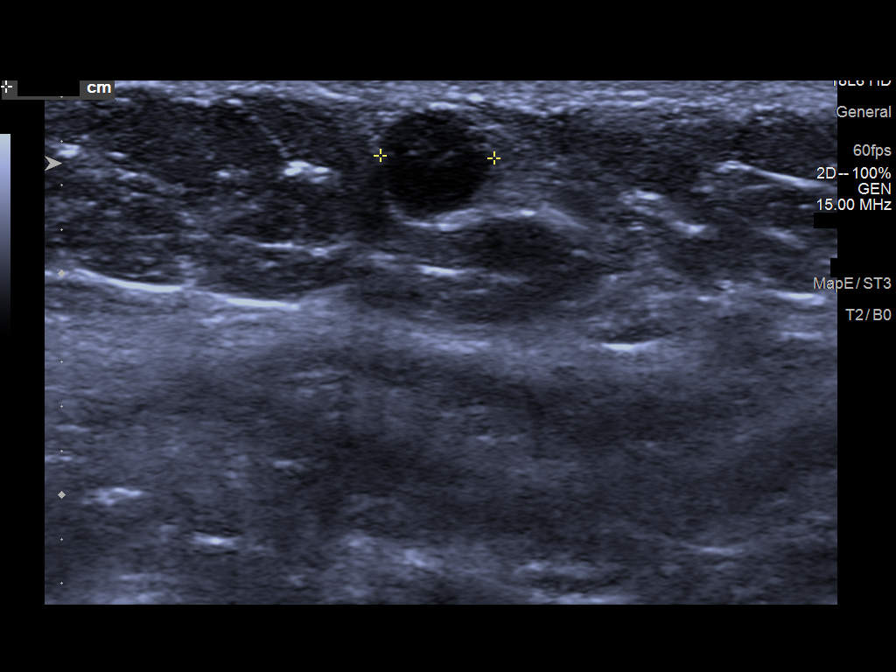
[im 6/6]
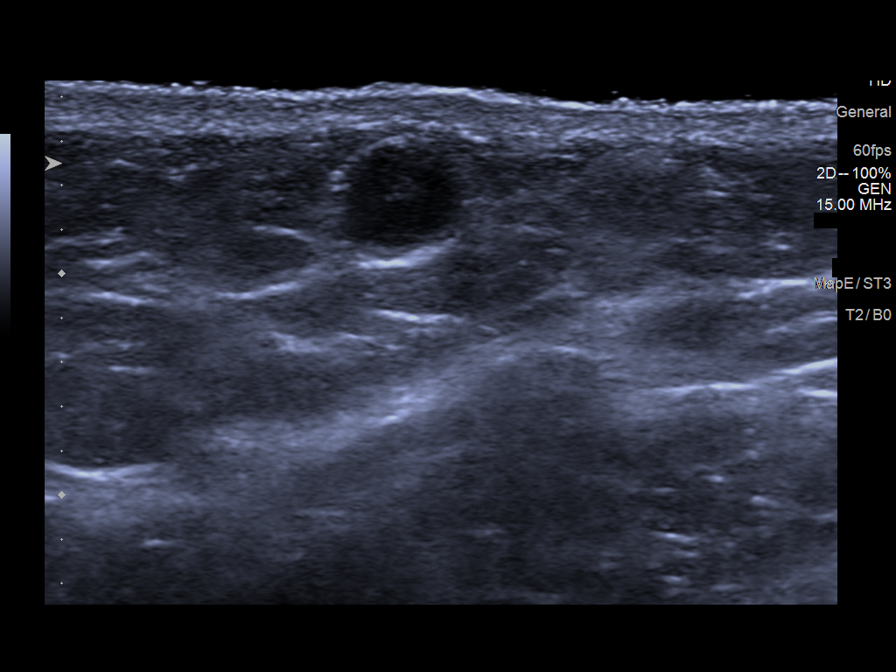

[Series 2: us breast*left* limited inc axilla · 0.07mm/px · 2 of 2 slices shown (2 of 2)]
[im 1/2]
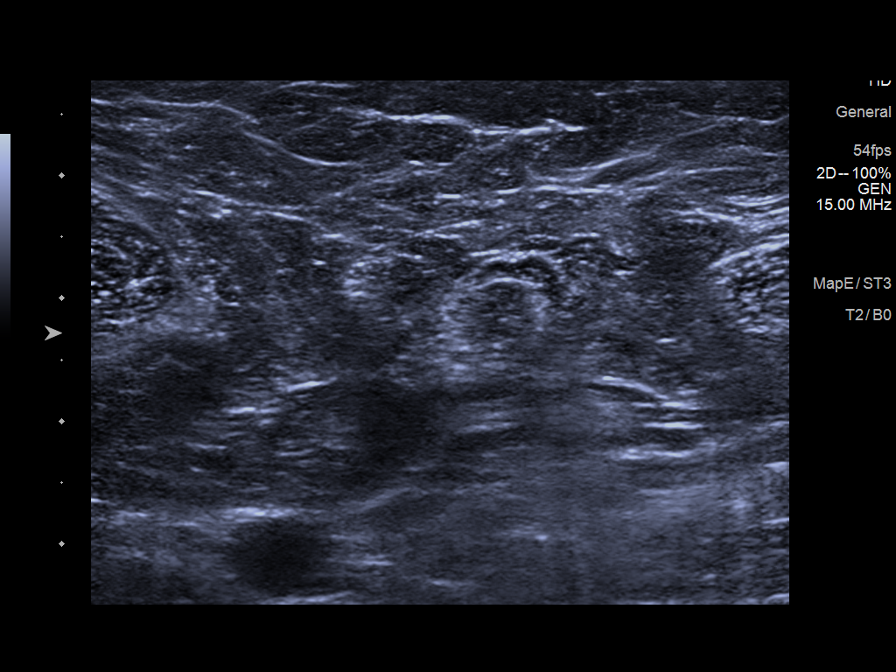
[im 2/2]
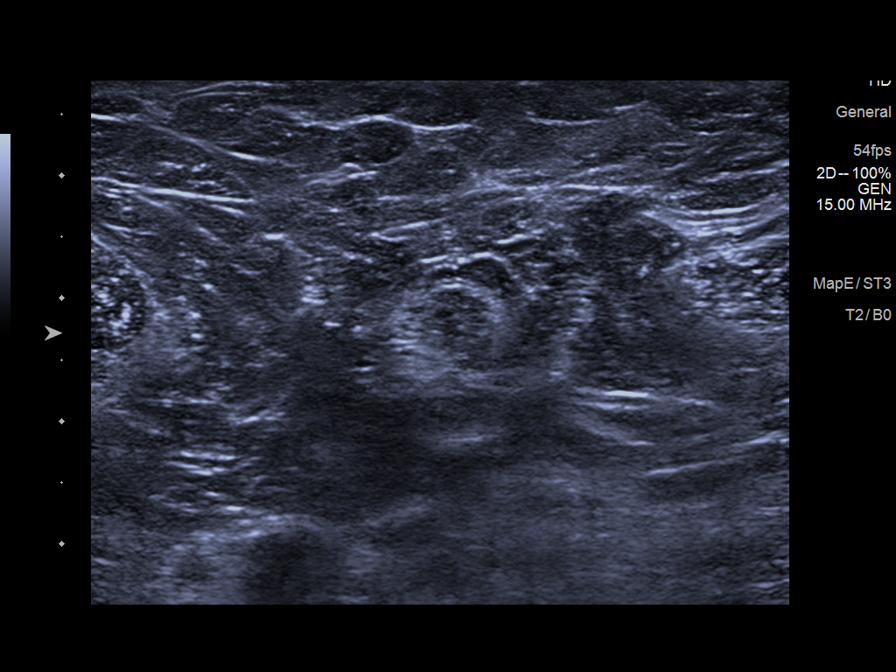

[8 of 8 positions shown; findings below may reference images not displayed]

ACR Breast Density Category b: There are scattered areas of
fibroglandular density.
FINDINGS: Mammogram:

Right breast: No suspicious mass, distortion, or microcalcifications
are identified to suggest presence of malignancy.

Left breast: Additional spot tangential views were performed at the
painful/palpable site in the left axillary tail demonstrating a
superficial 0.6 cm mass. No other suspicious mass, distortion, or
microcalcifications are identified to suggest presence of malignancy
in the left breast.

Mammographic images were processed with CAD.

On physical exam, I palpate a small skin nodule at the
painful/palpable site of concern in the left axillary region. There
is no overlying skin erythema or breakdown.

Ultrasound:

Targeted ultrasound is performed in the left axilla demonstrating an
oval circumscribed anechoic mass with some internal debris measuring
0.6 x 0.5 x 0.5 cm. No definite tract is identified to the skin on
today's exam. No internal blood flow demonstrated. This most
recently measured 0.4 x 0.4 x 0.5 cm, however measured up to 0.7 cm
in 5915.
IMPRESSION: 1. At the palpable/painful site of concern in the left axillary tail
there is a superficial cystic mass measuring 0.6 cm, slightly
increased in size compared to ultrasound from July 2018. The mass
most likely represents a benign sebaceous cyst though a definite
tract to the skin could not be identified today. No evidence of
infection.

2.  No mammographic evidence of malignancy in the bilateral breasts.

RECOMMENDATION:
1. Given that the left axillary tail mass, likely representing a
sebaceous cyst, is symptomatic, recommend dermatologic or surgical
consultation to consider excision.

2.  Screening mammogram in one year.(Code:QA-S-40J)

I have discussed the findings and recommendations with the patient.
If applicable, a reminder letter will be sent to the patient
regarding the next appointment.

BI-RADS CATEGORY  2: Benign.

## 2021-06-21 IMAGING — MG MM DIGITAL DIAGNOSTIC UNILAT*L* W/ TOMO W/ CAD
8 series · 8 of 24 positions shown · non-contrast
Comparison: Previous exam(s).

CLINICAL DATA: Patient presents with a painful/palpable area in the
left axillary region that was previously imaged. Recommendation was
for surgical/dermatological consultation to consider excision.
Patient states it was excised in December 2017 but records are not
available at this time.

EXAM:
DIGITAL DIAGNOSTIC BILATERAL MAMMOGRAM WITH CAD AND TOMO
ULTRASOUND LEFT BREAST

[L CC synth-2D]
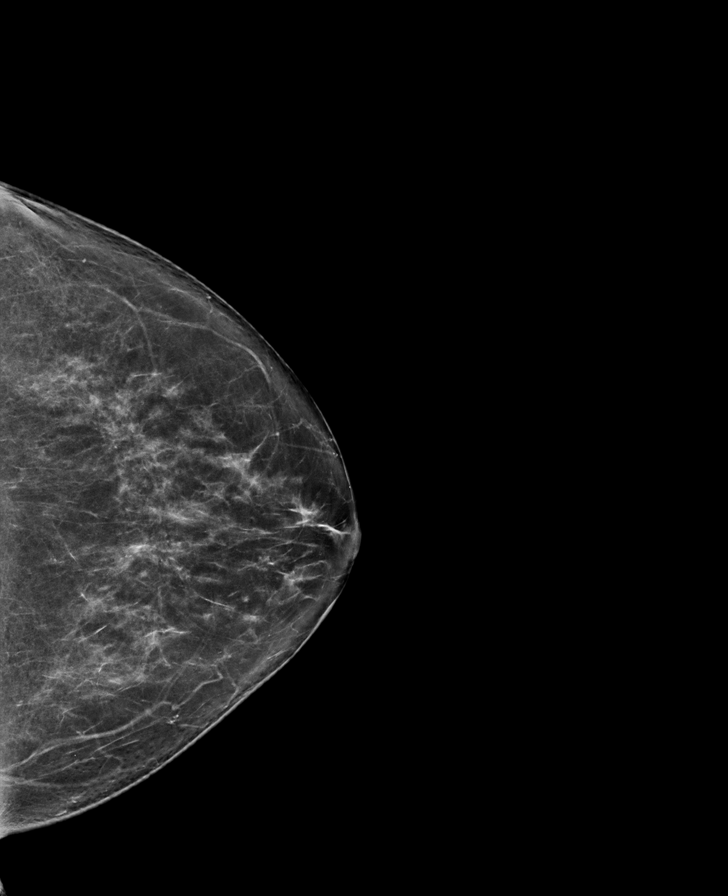

[L MLO synth-2D]
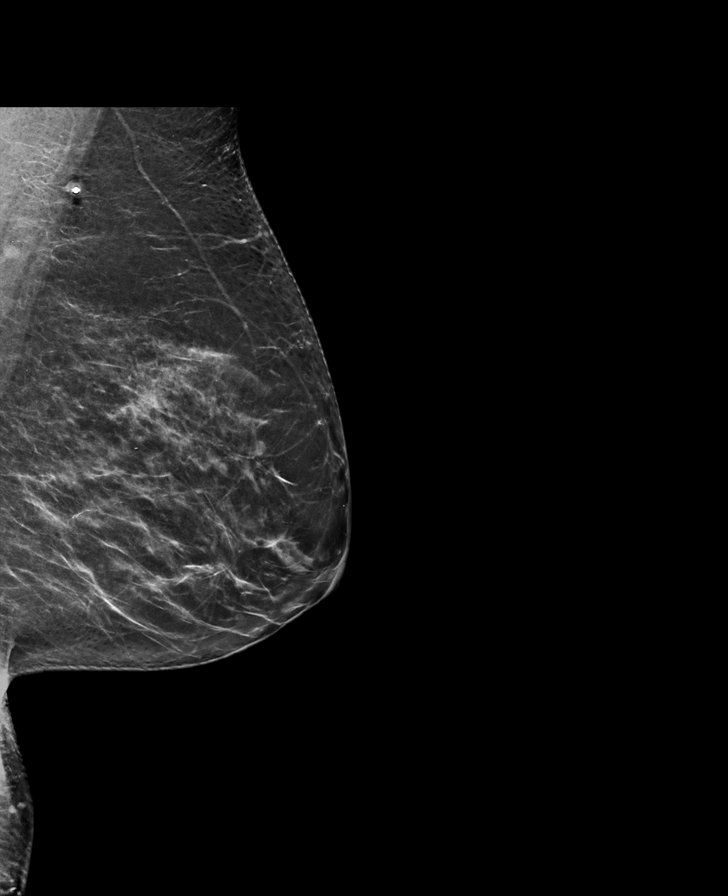

[L TAN synth-2D]
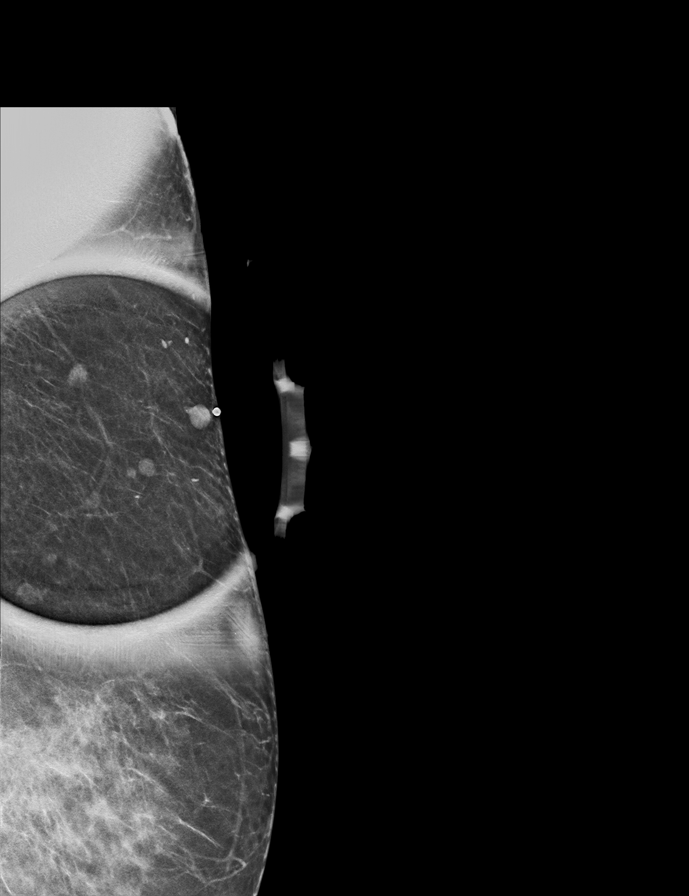

[L XCCL synth-2D]
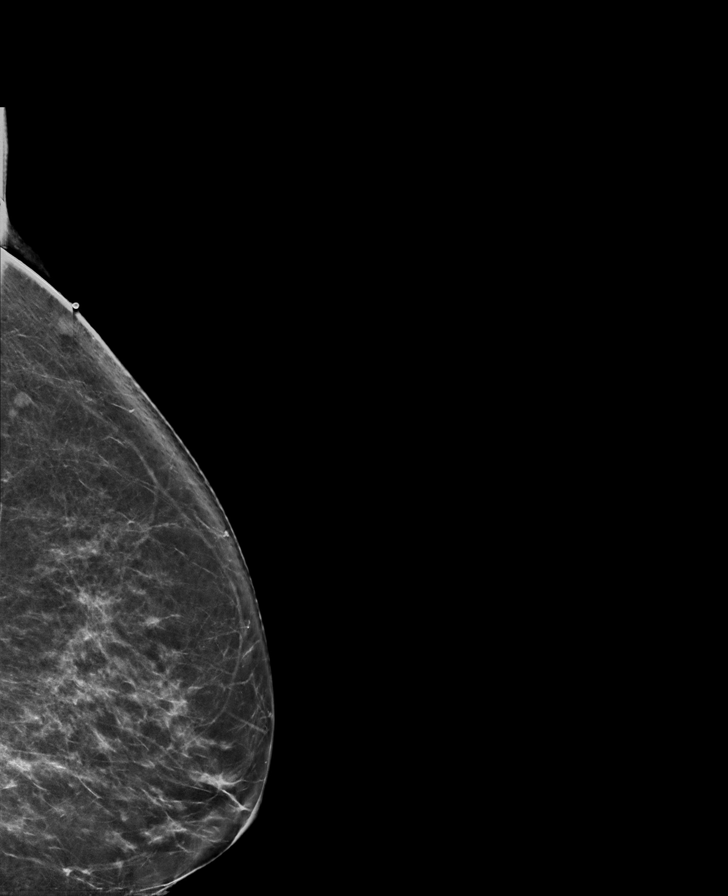

[L MLO tomo · tomo slice 35/68.0]
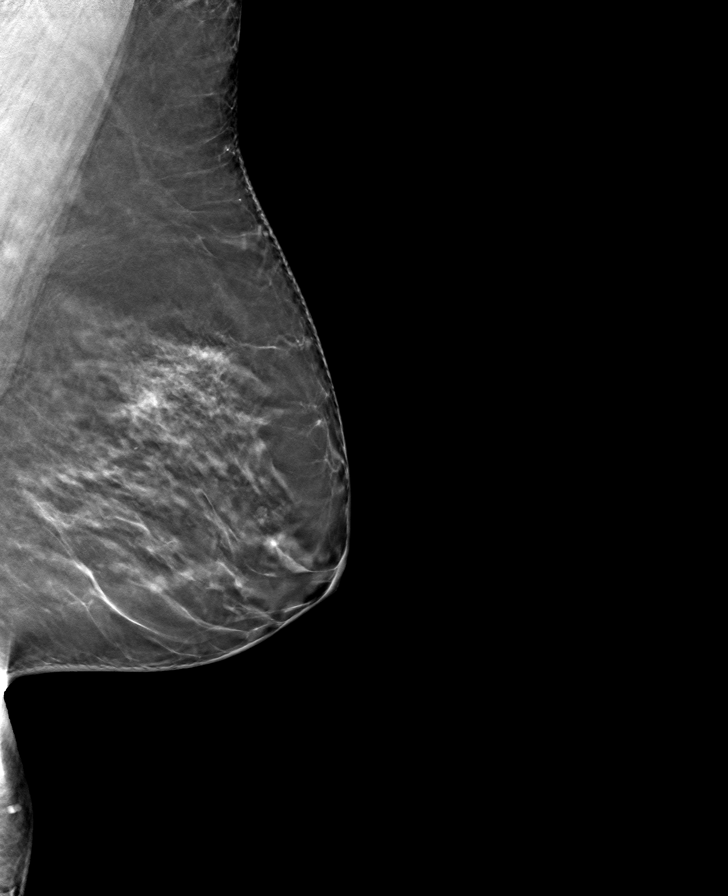

[L TAN tomo · tomo slice 29/58.0]
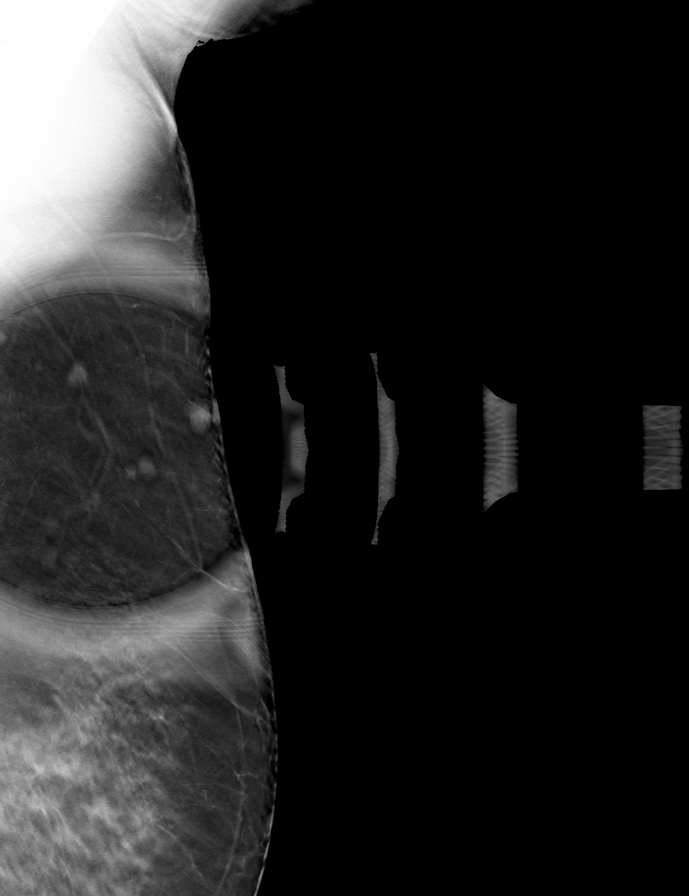

[L CC tomo · tomo slice 37/72.0]
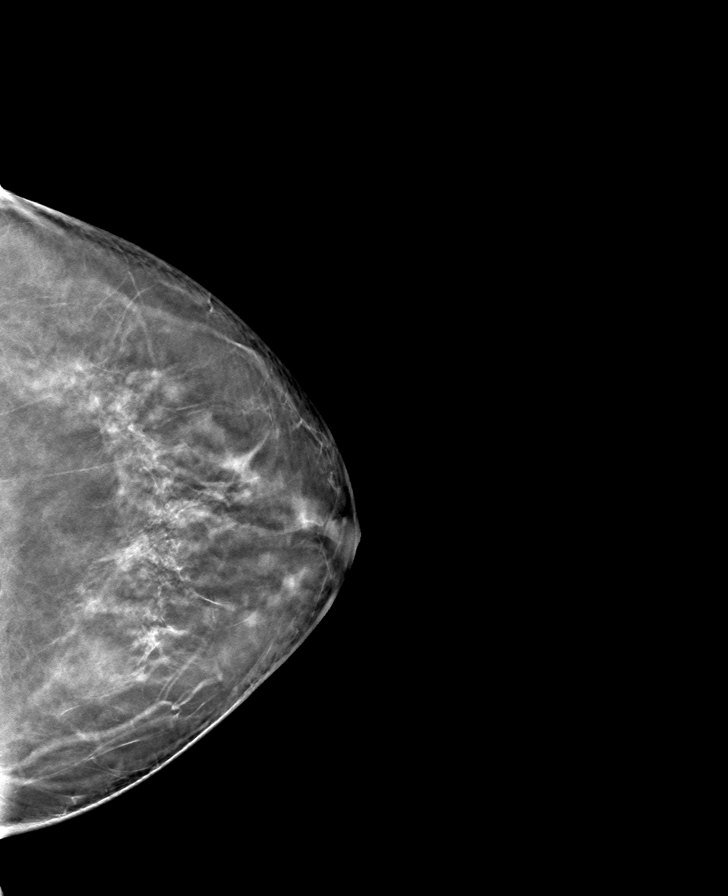

[L XCCL tomo · tomo slice 41/80.0]
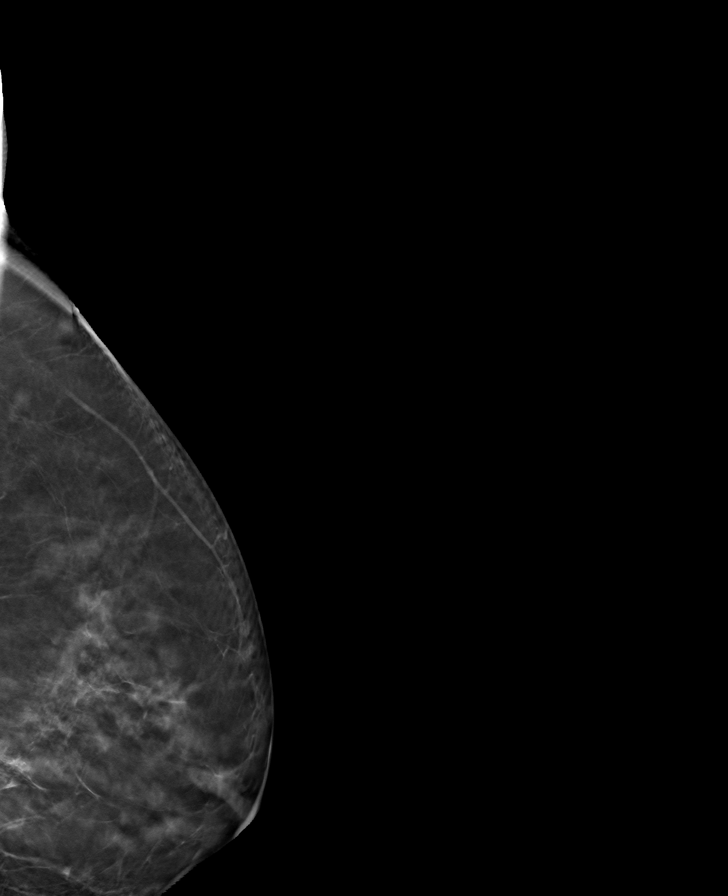

[8 of 24 positions shown; findings below may reference images not displayed]

ACR Breast Density Category b: There are scattered areas of
fibroglandular density.
FINDINGS: Mammogram:

Right breast: No suspicious mass, distortion, or microcalcifications
are identified to suggest presence of malignancy.

Left breast: Additional spot tangential views were performed at the
painful/palpable site in the left axillary tail demonstrating a
superficial 0.6 cm mass. No other suspicious mass, distortion, or
microcalcifications are identified to suggest presence of malignancy
in the left breast.

Mammographic images were processed with CAD.

On physical exam, I palpate a small skin nodule at the
painful/palpable site of concern in the left axillary region. There
is no overlying skin erythema or breakdown.

Ultrasound:

Targeted ultrasound is performed in the left axilla demonstrating an
oval circumscribed anechoic mass with some internal debris measuring
0.6 x 0.5 x 0.5 cm. No definite tract is identified to the skin on
today's exam. No internal blood flow demonstrated. This most
recently measured 0.4 x 0.4 x 0.5 cm, however measured up to 0.7 cm
in 5915.
IMPRESSION: 1. At the palpable/painful site of concern in the left axillary tail
there is a superficial cystic mass measuring 0.6 cm, slightly
increased in size compared to ultrasound from July 2018. The mass
most likely represents a benign sebaceous cyst though a definite
tract to the skin could not be identified today. No evidence of
infection.

2.  No mammographic evidence of malignancy in the bilateral breasts.

RECOMMENDATION:
1. Given that the left axillary tail mass, likely representing a
sebaceous cyst, is symptomatic, recommend dermatologic or surgical
consultation to consider excision.

2.  Screening mammogram in one year.(Code:QA-S-40J)

I have discussed the findings and recommendations with the patient.
If applicable, a reminder letter will be sent to the patient
regarding the next appointment.

BI-RADS CATEGORY  2: Benign.

## 2022-01-15 ENCOUNTER — Ambulatory Visit (INDEPENDENT_AMBULATORY_CARE_PROVIDER_SITE_OTHER): Payer: Medicare Other | Admitting: Internal Medicine

## 2022-01-15 VITALS — BP 132/71 | HR 62 | Resp 16 | Ht 67.0 in | Wt 160.0 lb

## 2022-01-15 DIAGNOSIS — Z7189 Other specified counseling: Secondary | ICD-10-CM

## 2022-01-15 DIAGNOSIS — G4733 Obstructive sleep apnea (adult) (pediatric): Secondary | ICD-10-CM | POA: Diagnosis not present

## 2022-01-15 NOTE — Patient Instructions (Signed)

## 2022-01-15 NOTE — Progress Notes (Signed)
[facility] [place], [place] [address]  Pulmonary Sleep Medicine   Office Visit Note  Patient Name: Amanda Middleton DOB: [birth year] MRN [id number]    Chief Complaint: Obstructive Sleep Apnea visit  Brief History:  Betty is seen today for an annual follow up on CPAP at 9 cmh20.  The patient has a 5 year history of sleep apnea. Patient is using PAP nightly.  The patient feels rested after sleeping with PAP.  The patient reports benefiting from PAP use. Reported sleepiness is  improved and the Epworth Sleepiness Score is 6 out of 24. The patient sometimes takes naps, 2-3 times per month. The patient complains of the following: She feels like the machine is not recording all her sleep.  The compliance download shows 88% compliance with an average use time of 5 hours 53 minutes. The AHI is 3.8.  The patient does not complain of limb movements disrupting sleep.  ROS  General: (-) fever, (-) chills, (-) night sweat Nose and Sinuses: (-) nasal stuffiness or itchiness, (-) postnasal drip, (-) nosebleeds, (-) sinus trouble. Mouth and Throat: (-) sore throat, (-) hoarseness. Neck: (-) swollen glands, (-) enlarged thyroid, (-) neck pain. Respiratory: - cough, - shortness of breath, - wheezing. Neurologic: - numbness, - tingling. Psychiatric: - anxiety, - depression   Current Medication: Outpatient Encounter Medications as of 01/15/2022  Medication Sig   alendronate (FOSAMAX) 70 MG tablet Take by mouth.   aspirin EC 81 MG tablet Take 81 mg by mouth daily.   gabapentin (NEURONTIN) 300 MG capsule Take by mouth.   hydrochlorothiazide (HYDRODIURIL) 25 MG tablet Take by mouth.   propranolol ER (INDERAL LA) 60 MG 24 hr capsule Take by mouth.   traZODone (DESYREL) 100 MG tablet Take 50-100 mg by mouth at bedtime.   [DISCONTINUED] alendronate (FOSAMAX) 70 MG tablet Take 70 mg by mouth once a week.   [DISCONTINUED] amLODipine (NORVASC) 10 MG tablet Take 10 mg by mouth daily.    [DISCONTINUED] amLODipine (NORVASC) 5 MG tablet Take 5 mg by mouth daily.   [DISCONTINUED] cholecalciferol (VITAMIN D) 1000 UNITS tablet Take 1,000 Units by mouth daily.   [DISCONTINUED] gabapentin (NEURONTIN) 300 MG capsule Take 300 mg by mouth 4 (four) times daily.   [DISCONTINUED] hydrochlorothiazide (HYDRODIURIL) 25 MG tablet Take 25 mg by mouth daily.   [DISCONTINUED] propranolol ER (INDERAL LA) 60 MG 24 hr capsule Take 60 mg by mouth daily.   No facility-administered encounter medications on file as of 01/15/2022.    Surgical History: Past Surgical History:  Procedure Laterality Date   ABDOMINAL HYSTERECTOMY     BLEPHAROPLASTY     BREAST SURGERY     CHOLECYSTECTOMY     LEG SURGERY     parotid gland     VASCULAR SURGERY     L carotid    Medical History: Past Medical History:  Diagnosis Date   Anxiety    Aortic atherosclerosis (HCC)    Benign tumor    left breast, left side of neck   Chronic insomnia    Hypertension    Osteopenia    Peripheral neuropathy    TIA (transient ischemic attack)     Family History: Non contributory to the present illness  Social History: Social History   Socioeconomic History   Marital status: Single    Spouse name: Not on file   Number of children: 0   Years of education: college   Highest education level: Bachelor's degree (e.g., BA, AB, BS)  Occupational History   Occupation: Retired  Tobacco Use   Smoking status: Never   Smokeless tobacco: Never  Vaping Use   Vaping Use: Never used  Substance and Sexual Activity   Alcohol use: No   Drug use: No   Sexual activity: Not on file  Other Topics Concern   Not on file  Social History Narrative   Lives at home alone.   Right-handed.   1 cup caffeine on some days.   Social Determinants of Health   Financial Resource Strain: Not on file  Food Insecurity: Not on file  Transportation Needs: Not on file  Physical Activity: Not on file  Stress: Not on file  Social  Connections: Not on file  Intimate Partner Violence: Not on file    Vital Signs: Blood pressure 132/71, pulse 62, resp. rate 16, height '5\' 7"'$  (1.702 m), weight 160 lb (72.6 kg), SpO2 98 %. Body mass index is 25.06 kg/m.    Examination: General Appearance: The patient is well-developed, well-nourished, and in no distress. Neck Circumference: 37 cm Skin: Gross inspection of skin unremarkable. Head: normocephalic, no gross deformities. Eyes: no gross deformities noted. ENT: ears appear grossly normal Neurologic: Alert and oriented. No involuntary movements.  STOP BANG RISK ASSESSMENT S (snore) Have you been told that you snore?     NO   T (tired) Are you often tired, fatigued, or sleepy during the day?   NO  O (obstruction) Do you stop breathing, choke, or gasp during sleep? NO   P (pressure) Do you have or are you being treated for high blood pressure? YES   B (BMI) Is your body index greater than 35 kg/m? NO   A (age) Are you 73 years old or older? YES   N (neck) Do you have a neck circumference greater than 16 inches?   NO   G (gender) Are you a female? NO   TOTAL STOP/BANG "YES" ANSWERS 2       A STOP-Bang score of 2 or less is considered low risk, and a score of 5 or more is high risk for having either moderate or severe OSA. For people who score 3 or 4, doctors may need to perform further assessment to determine how likely they are to have OSA.         EPWORTH SLEEPINESS SCALE:  Scale:  (0)= no chance of dozing; (1)= slight chance of dozing; (2)= moderate chance of dozing; (3)= high chance of dozing  Chance  Situtation    Sitting and reading: 2    Watching TV: 0    Sitting Inactive in public: 0    As a passenger in car: 1      Lying down to rest: 1    Sitting and talking: 0    Sitting quielty after lunch: 2    In a car, stopped in traffic: 0   TOTAL SCORE:   6 out of 24    SLEEP STUDIES:  PSG (07/29/17) AHI 21.4, min SPO2 72%   CPAP  COMPLIANCE DATA:  Date Range: 01/09/21 - 01/08/22  Average Daily Use: 5 hours 53 minutes  Median Use: 6 hours 21 minutes  Compliance for > 4 Hours: 320 days  AHI: 3.8 respiratory events per hour  Days Used: 347/365  Mask Leak: 24  95th Percentile Pressure: 9 cmh20         LABS: No results found for this or any previous visit (from the past 2160 hour(s)).  Radiology: MM DIAG  BREAST TOMO UNI LEFT  Result Date: 11/28/2018 CLINICAL DATA:  Patient presents with a painful/palpable area in the left axillary region that was previously imaged. Recommendation was for surgical/dermatological consultation to consider excision. Patient states it was excised in November 2019 but records are not available at this time. EXAM: DIGITAL DIAGNOSTIC BILATERAL MAMMOGRAM WITH CAD AND TOMO ULTRASOUND LEFT BREAST COMPARISON:  Previous exam(s). ACR Breast Density Category b: There are scattered areas of fibroglandular density. FINDINGS: Mammogram: Right breast: No suspicious mass, distortion, or microcalcifications are identified to suggest presence of malignancy. Left breast: Additional spot tangential views were performed at the painful/palpable site in the left axillary tail demonstrating a superficial 0.6 cm mass. No other suspicious mass, distortion, or microcalcifications are identified to suggest presence of malignancy in the left breast. Mammographic images were processed with CAD. On physical exam, I palpate a small skin nodule at the painful/palpable site of concern in the left axillary region. There is no overlying skin erythema or breakdown. Ultrasound: Targeted ultrasound is performed in the left axilla demonstrating an oval circumscribed anechoic mass with some internal debris measuring 0.6 x 0.5 x 0.5 cm. No definite tract is identified to the skin on today's exam. No internal blood flow demonstrated. This most recently measured 0.4 x 0.4 x 0.5 cm, however measured up to 0.7 cm in 2012.  IMPRESSION: 1. At the palpable/painful site of concern in the left axillary tail there is a superficial cystic mass measuring 0.6 cm, slightly increased in size compared to ultrasound from June 2020. The mass most likely represents a benign sebaceous cyst though a definite tract to the skin could not be identified today. No evidence of infection. 2.  No mammographic evidence of malignancy in the bilateral breasts. RECOMMENDATION: 1. Given that the left axillary tail mass, likely representing a sebaceous cyst, is symptomatic, recommend dermatologic or surgical consultation to consider excision. 2.  Screening mammogram in one year.(Code:SM-B-01Y) I have discussed the findings and recommendations with the patient. If applicable, a reminder letter will be sent to the patient regarding the next appointment. BI-RADS CATEGORY  2: Benign. Electronically Signed   By: Audie Pinto M.D.   On: 11/28/2018 15:29   US BREAST LTD UNI LEFT INC AXILLA  Result Date: 11/28/2018 CLINICAL DATA:  Patient presents with a painful/palpable area in the left axillary region that was previously imaged. Recommendation was for surgical/dermatological consultation to consider excision. Patient states it was excised in November 2019 but records are not available at this time. EXAM: DIGITAL DIAGNOSTIC BILATERAL MAMMOGRAM WITH CAD AND TOMO ULTRASOUND LEFT BREAST COMPARISON:  Previous exam(s). ACR Breast Density Category b: There are scattered areas of fibroglandular density. FINDINGS: Mammogram: Right breast: No suspicious mass, distortion, or microcalcifications are identified to suggest presence of malignancy. Left breast: Additional spot tangential views were performed at the painful/palpable site in the left axillary tail demonstrating a superficial 0.6 cm mass. No other suspicious mass, distortion, or microcalcifications are identified to suggest presence of malignancy in the left breast. Mammographic images were processed with CAD. On  physical exam, I palpate a small skin nodule at the painful/palpable site of concern in the left axillary region. There is no overlying skin erythema or breakdown. Ultrasound: Targeted ultrasound is performed in the left axilla demonstrating an oval circumscribed anechoic mass with some internal debris measuring 0.6 x 0.5 x 0.5 cm. No definite tract is identified to the skin on today's exam. No internal blood flow demonstrated. This most recently measured 0.4 x 0.4 x  0.5 cm, however measured up to 0.7 cm in 2012. IMPRESSION: 1. At the palpable/painful site of concern in the left axillary tail there is a superficial cystic mass measuring 0.6 cm, slightly increased in size compared to ultrasound from June 2020. The mass most likely represents a benign sebaceous cyst though a definite tract to the skin could not be identified today. No evidence of infection. 2.  No mammographic evidence of malignancy in the bilateral breasts. RECOMMENDATION: 1. Given that the left axillary tail mass, likely representing a sebaceous cyst, is symptomatic, recommend dermatologic or surgical consultation to consider excision. 2.  Screening mammogram in one year.(Code:SM-B-01Y) I have discussed the findings and recommendations with the patient. If applicable, a reminder letter will be sent to the patient regarding the next appointment. BI-RADS CATEGORY  2: Benign. Electronically Signed   By: Audie Pinto M.D.   On: 11/28/2018 15:29    No results found.  No results found.    Assessment and Plan: Patient Active Problem List   Diagnosis Date Noted   Mild cognitive impairment 01/29/2017   Dysphagia 01/29/2017   Essential hypertension 09/21/2013   PREMATURE VENTRICULAR CONTRACTIONS 05/15/2008   PALPITATIONS 05/15/2008   CHEST PAIN 05/15/2008   HYPERLIPIDEMIA 09/02/2007   Anxiety state 09/02/2007   DEPRESSION 09/02/2007   PNEUMONIA 09/02/2007   IRRITABLE BOWEL SYNDROME 09/02/2007   CHOLELITHIASIS 09/02/2007    HEADACHE, CHRONIC 09/02/2007   RHEUMATIC FEVER 09/01/2007   NAUSEA 09/01/2007   1. OSA on CPAP The patient does tolerate PAP and reports  benefit from PAP use. The patient was reminded how to clean equipment and advised to replace supplies routinely. The patient was also counselled on weight loss. The compliance is very good. The AHI is 3.8.   OSA on cpap- controlled. Continue with excellent compliance with pap. CPAP continues to be medically necessary to treat this patient's OSA. F/u one year.     2. CPAP use counseling CPAP Counseling: had a lengthy discussion with the patient regarding the importance of PAP therapy in management of the sleep apnea. Patient appears to understand the risk factor reduction and also understands the risks associated with untreated sleep apnea. Patient will try to make a good faith effort to remain compliant with therapy. Also instructed the patient on proper cleaning of the device including the water must be changed daily if possible and use of distilled water is preferred. Patient understands that the machine should be regularly cleaned with appropriate recommended cleaning solutions that do not damage the PAP machine for example given white vinegar and water rinses. Other methods such as ozone treatment may not be as good as these simple methods to achieve cleaning.     General Counseling: I have discussed the findings of the evaluation and examination with Keiona.  I have also discussed any further diagnostic evaluation thatmay be needed or ordered today. Cynitha verbalizes understanding of the findings of todays visit. We also reviewed her medications today and discussed drug interactions and side effects including but not limited excessive drowsiness and altered mental states. We also discussed that there is always a risk not just to her but also people around her. she has been encouraged to call the office with any questions or concerns that should arise related to  todays visit.  No orders of the defined types were placed in this encounter.       I have personally obtained a history, examined the patient, evaluated laboratory and imaging results, formulated the assessment and plan  and placed orders. This patient was seen today by Tressie Ellis, PA-C in collaboration with Dr. Devona Konig.   Allyne Gee, MD [facility] Diplomate ABMS Pulmonary Critical Care Medicine and Sleep Medicine
